# Patient Record
Sex: Male | Born: 1940 | Race: White | Hispanic: No | Marital: Single | Smoking: Former smoker
Health system: Southern US, Community
[De-identification: ages and names within clinical notes are randomized; demographics above are authoritative.]

## PROBLEM LIST (undated history)

## (undated) DIAGNOSIS — E119 Type 2 diabetes mellitus without complications: Secondary | ICD-10-CM

## (undated) DIAGNOSIS — I10 Essential (primary) hypertension: Secondary | ICD-10-CM

## (undated) DIAGNOSIS — I4891 Unspecified atrial fibrillation: Secondary | ICD-10-CM

## (undated) DIAGNOSIS — I509 Heart failure, unspecified: Secondary | ICD-10-CM

## (undated) DIAGNOSIS — I2510 Atherosclerotic heart disease of native coronary artery without angina pectoris: Secondary | ICD-10-CM

## (undated) HISTORY — PX: CARDIAC SURGERY: SHX584

## (undated) HISTORY — PX: CORONARY ARTERY BYPASS GRAFT: SHX141

---

## 2006-09-12 ENCOUNTER — Inpatient Hospital Stay: Payer: Self-pay | Admitting: Internal Medicine

## 2006-09-21 ENCOUNTER — Emergency Department: Payer: Self-pay | Admitting: Internal Medicine

## 2010-12-21 ENCOUNTER — Ambulatory Visit: Payer: Self-pay | Admitting: Family Medicine

## 2017-06-28 ENCOUNTER — Other Ambulatory Visit: Payer: Self-pay | Admitting: Family Medicine

## 2017-06-28 ENCOUNTER — Ambulatory Visit
Admission: RE | Admit: 2017-06-28 | Discharge: 2017-06-28 | Disposition: A | Discharge status: Home / Self Care | Payer: Medicare HMO | Source: Ambulatory Visit | Attending: Family Medicine | Admitting: Family Medicine

## 2017-06-28 DIAGNOSIS — R0602 Shortness of breath: Secondary | ICD-10-CM | POA: Insufficient documentation

## 2017-06-28 DIAGNOSIS — J948 Other specified pleural conditions: Secondary | ICD-10-CM | POA: Insufficient documentation

## 2017-10-05 ENCOUNTER — Inpatient Hospital Stay
Admit: 2017-10-05 | Discharge: 2017-10-05 | Disposition: A | Discharge status: Home / Self Care | Payer: Medicare HMO | Attending: Internal Medicine | Admitting: Internal Medicine

## 2017-10-05 ENCOUNTER — Other Ambulatory Visit: Payer: Self-pay

## 2017-10-05 ENCOUNTER — Encounter: Payer: Self-pay | Admitting: Emergency Medicine

## 2017-10-05 ENCOUNTER — Emergency Department: Payer: Medicare HMO

## 2017-10-05 ENCOUNTER — Inpatient Hospital Stay
Admission: EM | Admit: 2017-10-05 | Discharge: 2017-10-09 | DRG: 291 | Disposition: A | Discharge status: Home Health | Payer: Medicare HMO | Attending: Internal Medicine | Admitting: Internal Medicine

## 2017-10-05 DIAGNOSIS — I509 Heart failure, unspecified: Secondary | ICD-10-CM

## 2017-10-05 DIAGNOSIS — Z951 Presence of aortocoronary bypass graft: Secondary | ICD-10-CM

## 2017-10-05 DIAGNOSIS — I501 Left ventricular failure: Secondary | ICD-10-CM | POA: Diagnosis present

## 2017-10-05 DIAGNOSIS — Z7984 Long term (current) use of oral hypoglycemic drugs: Secondary | ICD-10-CM

## 2017-10-05 DIAGNOSIS — I2510 Atherosclerotic heart disease of native coronary artery without angina pectoris: Secondary | ICD-10-CM | POA: Diagnosis present

## 2017-10-05 DIAGNOSIS — Z7982 Long term (current) use of aspirin: Secondary | ICD-10-CM | POA: Diagnosis not present

## 2017-10-05 DIAGNOSIS — R749 Abnormal serum enzyme level, unspecified: Secondary | ICD-10-CM | POA: Diagnosis present

## 2017-10-05 DIAGNOSIS — E119 Type 2 diabetes mellitus without complications: Secondary | ICD-10-CM | POA: Diagnosis present

## 2017-10-05 DIAGNOSIS — Z8249 Family history of ischemic heart disease and other diseases of the circulatory system: Secondary | ICD-10-CM

## 2017-10-05 DIAGNOSIS — Z6836 Body mass index (BMI) 36.0-36.9, adult: Secondary | ICD-10-CM

## 2017-10-05 DIAGNOSIS — Z880 Allergy status to penicillin: Secondary | ICD-10-CM | POA: Diagnosis not present

## 2017-10-05 DIAGNOSIS — I248 Other forms of acute ischemic heart disease: Secondary | ICD-10-CM | POA: Diagnosis present

## 2017-10-05 DIAGNOSIS — J9601 Acute respiratory failure with hypoxia: Secondary | ICD-10-CM | POA: Diagnosis present

## 2017-10-05 DIAGNOSIS — Z9981 Dependence on supplemental oxygen: Secondary | ICD-10-CM

## 2017-10-05 DIAGNOSIS — Z79899 Other long term (current) drug therapy: Secondary | ICD-10-CM | POA: Diagnosis not present

## 2017-10-05 DIAGNOSIS — Z87891 Personal history of nicotine dependence: Secondary | ICD-10-CM | POA: Diagnosis not present

## 2017-10-05 DIAGNOSIS — I482 Chronic atrial fibrillation: Secondary | ICD-10-CM | POA: Diagnosis present

## 2017-10-05 DIAGNOSIS — Z7901 Long term (current) use of anticoagulants: Secondary | ICD-10-CM | POA: Diagnosis not present

## 2017-10-05 DIAGNOSIS — I110 Hypertensive heart disease with heart failure: Secondary | ICD-10-CM | POA: Diagnosis not present

## 2017-10-05 HISTORY — DX: Heart failure, unspecified: I50.9

## 2017-10-05 HISTORY — DX: Type 2 diabetes mellitus without complications: E11.9

## 2017-10-05 HISTORY — DX: Atherosclerotic heart disease of native coronary artery without angina pectoris: I25.10

## 2017-10-05 HISTORY — DX: Essential (primary) hypertension: I10

## 2017-10-05 HISTORY — DX: Unspecified atrial fibrillation: I48.91

## 2017-10-05 LAB — BASIC METABOLIC PANEL
ANION GAP: 8 (ref 5–15)
BUN: 22 mg/dL — ABNORMAL HIGH (ref 6–20)
CHLORIDE: 98 mmol/L — AB (ref 101–111)
CO2: 31 mmol/L (ref 22–32)
Calcium: 8.6 mg/dL — ABNORMAL LOW (ref 8.9–10.3)
Creatinine, Ser: 1.28 mg/dL — ABNORMAL HIGH (ref 0.61–1.24)
GFR calc Af Amer: >60 mL/min (ref >60)
GFR calc non Af Amer: 53 mL/min — ABNORMAL LOW (ref >60)
GLUCOSE: 217 mg/dL — AB (ref 65–99)
POTASSIUM: 4.2 mmol/L (ref 3.5–5.1)
Sodium: 137 mmol/L (ref 135–145)

## 2017-10-05 LAB — CBC WITH DIFFERENTIAL/PLATELET
BASOS ABS: 0.1 10*3/uL (ref 0–0.1)
Basophils Relative: 1 %
Eosinophils Absolute: 0.1 10*3/uL (ref 0–0.7)
Eosinophils Relative: 1 %
HEMATOCRIT: 38.2 % — AB (ref 40.0–52.0)
Hemoglobin: 12.1 g/dL — ABNORMAL LOW (ref 13.0–18.0)
LYMPHS PCT: 12 %
Lymphs Abs: 0.7 10*3/uL — ABNORMAL LOW (ref 1.0–3.6)
MCH: 27.5 pg (ref 26.0–34.0)
MCHC: 31.7 g/dL — ABNORMAL LOW (ref 32.0–36.0)
MCV: 86.8 fL (ref 80.0–100.0)
MONO ABS: 0.5 10*3/uL (ref 0.2–1.0)
Monocytes Relative: 8 %
NEUTROS ABS: 4.7 10*3/uL (ref 1.4–6.5)
Neutrophils Relative %: 78 %
Platelets: 198 10*3/uL (ref 150–440)
RBC: 4.4 MIL/uL (ref 4.40–5.90)
RDW: 16.8 % — AB (ref 11.5–14.5)
WBC: 6 10*3/uL (ref 3.8–10.6)

## 2017-10-05 LAB — HEMOGLOBIN A1C
HEMOGLOBIN A1C: 6.8 % — AB (ref 4.8–5.6)
Mean Plasma Glucose: 148.46 mg/dL

## 2017-10-05 LAB — TROPONIN I
TROPONIN I: 0.31 ng/mL — AB (ref <0.03)
Troponin I: 0.32 ng/mL (ref <0.03)
Troponin I: 0.32 ng/mL (ref <0.03)

## 2017-10-05 LAB — HEPARIN LEVEL (UNFRACTIONATED): HEPARIN UNFRACTIONATED: 1.75 [IU]/mL — AB (ref 0.30–0.70)

## 2017-10-05 LAB — GLUCOSE, CAPILLARY
GLUCOSE-CAPILLARY: 184 mg/dL — AB (ref 65–99)
GLUCOSE-CAPILLARY: 168 mg/dL — AB (ref 65–99)

## 2017-10-05 LAB — PROTIME-INR
INR: 1.21
PROTHROMBIN TIME: 15.2 s (ref 11.4–15.2)

## 2017-10-05 LAB — BRAIN NATRIURETIC PEPTIDE: B Natriuretic Peptide: 620 pg/mL — ABNORMAL HIGH (ref 0.0–100.0)

## 2017-10-05 LAB — APTT: APTT: 38 s — AB (ref 24–36)

## 2017-10-05 LAB — MAGNESIUM: Magnesium: 2.1 mg/dL (ref 1.7–2.4)

## 2017-10-05 MED ORDER — PRAVASTATIN SODIUM 40 MG PO TABS
40.0000 mg | ORAL_TABLET | Freq: Every day | ORAL | Status: DC
  Administered 2017-10-05 – 2017-10-09 (×5): 40 mg via ORAL
  Filled 2017-10-05 (×5): qty 1

## 2017-10-05 MED ORDER — ONDANSETRON HCL 4 MG/2ML IJ SOLN: 4.0000 mg | Freq: Four times a day (QID) | INTRAMUSCULAR | Status: DC | PRN

## 2017-10-05 MED ORDER — ASPIRIN EC 81 MG PO TBEC
81.0000 mg | DELAYED_RELEASE_TABLET | Freq: Every day | ORAL | Status: DC
  Administered 2017-10-05 – 2017-10-09 (×5): 81 mg via ORAL
  Filled 2017-10-05 (×5): qty 1

## 2017-10-05 MED ORDER — BISACODYL 5 MG PO TBEC: 5.0000 mg | DELAYED_RELEASE_TABLET | Freq: Every day | ORAL | Status: DC | PRN

## 2017-10-05 MED ORDER — HYDRALAZINE HCL 20 MG/ML IJ SOLN: 10.0000 mg | Freq: Four times a day (QID) | INTRAMUSCULAR | Status: DC | PRN

## 2017-10-05 MED ORDER — SODIUM CHLORIDE 0.9% FLUSH
3.0000 mL | Freq: Two times a day (BID) | INTRAVENOUS | Status: DC
  Administered 2017-10-05 – 2017-10-08 (×8): 3 mL via INTRAVENOUS

## 2017-10-05 MED ORDER — HEPARIN BOLUS VIA INFUSION
4000.0000 [IU] | Freq: Once | INTRAVENOUS | Status: AC
  Administered 2017-10-05: 4000 [IU] via INTRAVENOUS
  Filled 2017-10-05: qty 4000

## 2017-10-05 MED ORDER — LOSARTAN POTASSIUM 50 MG PO TABS
100.0000 mg | ORAL_TABLET | Freq: Every day | ORAL | Status: DC
  Administered 2017-10-05 – 2017-10-09 (×5): 100 mg via ORAL
  Filled 2017-10-05 (×5): qty 2

## 2017-10-05 MED ORDER — HYDROCODONE-ACETAMINOPHEN 5-325 MG PO TABS
1.0000 | ORAL_TABLET | ORAL | Status: DC | PRN
  Administered 2017-10-09 (×2): 1 via ORAL
  Filled 2017-10-05: qty 2
  Filled 2017-10-05 (×2): qty 1

## 2017-10-05 MED ORDER — SENNOSIDES-DOCUSATE SODIUM 8.6-50 MG PO TABS: 1.0000 | ORAL_TABLET | Freq: Every evening | ORAL | Status: DC | PRN

## 2017-10-05 MED ORDER — ONDANSETRON HCL 4 MG PO TABS: 4.0000 mg | ORAL_TABLET | Freq: Four times a day (QID) | ORAL | Status: DC | PRN

## 2017-10-05 MED ORDER — ALBUTEROL SULFATE (2.5 MG/3ML) 0.083% IN NEBU: 2.5000 mg | INHALATION_SOLUTION | RESPIRATORY_TRACT | Status: DC | PRN

## 2017-10-05 MED ORDER — ACETAMINOPHEN 325 MG PO TABS: 650.0000 mg | ORAL_TABLET | Freq: Four times a day (QID) | ORAL | Status: DC | PRN

## 2017-10-05 MED ORDER — INSULIN ASPART 100 UNIT/ML ~~LOC~~ SOLN
0.0000 [IU] | Freq: Every day | SUBCUTANEOUS | Status: DC
  Administered 2017-10-05: 0 [IU] via SUBCUTANEOUS
  Administered 2017-10-06 – 2017-10-08 (×3): 2 [IU] via SUBCUTANEOUS
  Filled 2017-10-05 (×3): qty 1

## 2017-10-05 MED ORDER — FUROSEMIDE 10 MG/ML IJ SOLN
40.0000 mg | Freq: Once | INTRAMUSCULAR | Status: AC
  Administered 2017-10-05: 40 mg via INTRAVENOUS
  Filled 2017-10-05: qty 4

## 2017-10-05 MED ORDER — SODIUM CHLORIDE 0.9% FLUSH: 3.0000 mL | INTRAVENOUS | Status: DC | PRN

## 2017-10-05 MED ORDER — INSULIN ASPART 100 UNIT/ML ~~LOC~~ SOLN
0.0000 [IU] | Freq: Three times a day (TID) | SUBCUTANEOUS | Status: DC
  Administered 2017-10-05: 2 [IU] via SUBCUTANEOUS
  Administered 2017-10-06 – 2017-10-08 (×7): 1 [IU] via SUBCUTANEOUS
  Administered 2017-10-08 – 2017-10-09 (×3): 2 [IU] via SUBCUTANEOUS
  Administered 2017-10-09: 1 [IU] via SUBCUTANEOUS
  Filled 2017-10-05 (×13): qty 1

## 2017-10-05 MED ORDER — METOPROLOL SUCCINATE ER 50 MG PO TB24
50.0000 mg | ORAL_TABLET | Freq: Every day | ORAL | Status: DC
  Administered 2017-10-05 – 2017-10-09 (×5): 50 mg via ORAL
  Filled 2017-10-05 (×5): qty 1

## 2017-10-05 MED ORDER — PERFLUTREN LIPID MICROSPHERE
1.0000 mL | INTRAVENOUS | Status: AC | PRN
  Administered 2017-10-05: 2 mL via INTRAVENOUS
  Filled 2017-10-05: qty 10

## 2017-10-05 MED ORDER — APIXABAN 5 MG PO TABS
5.0000 mg | ORAL_TABLET | Freq: Two times a day (BID) | ORAL | Status: DC
  Administered 2017-10-05 – 2017-10-09 (×9): 5 mg via ORAL
  Filled 2017-10-05 (×9): qty 1

## 2017-10-05 MED ORDER — ACETAMINOPHEN 650 MG RE SUPP: 650.0000 mg | Freq: Four times a day (QID) | RECTAL | Status: DC | PRN

## 2017-10-05 MED ORDER — FUROSEMIDE 10 MG/ML IJ SOLN
40.0000 mg | Freq: Two times a day (BID) | INTRAMUSCULAR | Status: DC
  Administered 2017-10-05 – 2017-10-07 (×4): 40 mg via INTRAVENOUS
  Filled 2017-10-05 (×4): qty 4

## 2017-10-05 MED ORDER — HEPARIN (PORCINE) IN NACL 100-0.45 UNIT/ML-% IJ SOLN
1200.0000 [IU]/h | INTRAMUSCULAR | Status: DC
  Administered 2017-10-05: 1200 [IU]/h via INTRAVENOUS
  Filled 2017-10-05: qty 250

## 2017-10-05 MED ORDER — SODIUM CHLORIDE 0.9 % IV SOLN: 250.0000 mL | INTRAVENOUS | Status: DC | PRN

## 2017-10-05 NOTE — ED Notes (Signed)
Date and time results received: 10/05/17 1151  Test: Troponin Critical Value: 0.32  Name of Provider Notified: Schaevitz

## 2017-10-05 NOTE — Progress Notes (Signed)
ANTICOAGULATION CONSULT NOTE - Initial Consult  Pharmacy Consult for Heparin Drip  Indication: chest pain/ACS  Allergies  Allergen Reactions  . Penicillins Rash    Patient Measurements: Height: 5\' 11"  (180.3 cm) Weight: 260 lb (117.9 kg) IBW/kg (Calculated) : 75.3 Heparin Dosing Weight: 97 kg  Vital Signs: Temp: 98 F (36.7 C) (12/12 1057) Temp Source: Oral (12/12 1057) BP: 171/87 (12/12 1057) Pulse Rate: 98 (12/12 1057)  Labs: Recent Labs    10/05/17 1059  HGB 12.1*  HCT 38.2*  PLT 198  CREATININE 1.28*  TROPONINI 0.32*    Estimated Creatinine Clearance: 64.1 mL/min (A) (by C-G formula based on SCr of 1.28 mg/dL (H)).   Medical History: Past Medical History:  Diagnosis Date  . CHF (congestive heart failure) (HCC)   . Diabetes mellitus without complication (HCC)   . Hypertension     Assessment: Pharmacy consulted for heparin dosing and monitoring in 76 yo male with SOB and Troponin I of 0.32. Patient does take Eliquis (Apixaban) at home. Last reported dose was 12/11.   Goal of Therapy:  Heparin level 0.3-0.7 units/ml aPTT 66-102 seconds Monitor platelets by anticoagulation protocol: Yes   Plan:  Patient's dosing weight is 97kg. Baseline labs ordered  Give 4000 units bolus x 1 Start heparin infusion at 1200 units/hr Check anti-Xa level and aPTT in 8 hours and daily while on heparin.  Will need to adjust heparin infusion based on aPTT levels until aPTT and HL correlate, if initial HL is elevated.  Continue to monitor H&H and platelets  Gardner Candle, PharmD, BCPS Clinical Pharmacist 10/05/2017 12:15 PM

## 2017-10-05 NOTE — Plan of Care (Signed)
  Clinical Measurements: Ability to maintain clinical measurements within normal limits will improve 10/05/2017 1645 - Not Progressing by Raynald Blend, RN Note Troponin x 2 = .32. Per physician = demand ischemia. Will continue to monitor labs. Jari Favre [facility]

## 2017-10-05 NOTE — Consult Note (Signed)
[facility]  CARDIOLOGY CONSULT NOTE  Patient ID: Gilbert Patrick MRN: [id number] DOB/AGE: 76/06/1941 76 y.o.  Admit date: 10/05/2017 Referring Physician Dr. Imogene Burnhen Primary Physician Dr. Quillian QuinceBliss  Primary Cardiologist none Reason for Consultation chf  HPI: Pt is a 76 yo male with apparent history of afib, cad, chf and dm with no recent cardiology follow up. He presented to the er with several weeks in increasing sob. He had been off of all of his meds for several days and has been eating a very high sodium diet. His son states he has had peripheral edema for years. He is on oxygen at home and is unaware of possible sleep apnea. He has a mild troponin elevation to 0.32 which has been unchanged. His BNP is 620. He has been on eliquis per his outpatient fp physician. CXR shows pulmonary edema. He complains of weakness and sob but no chest pain.   Review of Systems  HENT: Negative.   Eyes: Negative.   Respiratory: Positive for cough and shortness of breath.   Cardiovascular: Positive for leg swelling.  Gastrointestinal: Negative.   Genitourinary: Negative.   Musculoskeletal: Negative.   Skin: Negative.   Neurological: Positive for weakness.  Endo/Heme/Allergies: Negative.   Psychiatric/Behavioral: Negative.     Past Medical History:  Diagnosis Date  . A-fib (HCC)   . CAD (coronary artery disease)   . CHF (congestive heart failure) (HCC)   . Diabetes mellitus without complication (HCC)   . Hypertension     Family History  Problem Relation Age of Onset  . Cancer Mother   . Heart attack Father     Social History   Socioeconomic History  . Marital status: Single    Spouse name: Not on file  . Number of children: Not on file  . Years of education: Not on file  . Highest education level: Not on file  Social Needs  . Financial resource strain: Not on file  . Food insecurity - worry: Not on file  . Food insecurity - inability: Not on  file  . Transportation needs - medical: Not on file  . Transportation needs - non-medical: Not on file  Occupational History  . Not on file  Tobacco Use  . Smoking status: Former Smoker  Substance and Sexual Activity  . Alcohol use: No    Frequency: Never  . Drug use: No  . Sexual activity: Not on file  Other Topics Concern  . Not on file  Social History Narrative  . Not on file    Past Surgical History:  Procedure Laterality Date  . CARDIAC SURGERY    . CORONARY ARTERY BYPASS GRAFT       Medications Prior to Admission  Medication Sig Dispense Refill Last Dose  . apixaban (ELIQUIS) 5 MG TABS tablet Take 5 mg by mouth 2 (two) times daily.   Past Week at 0800  . aspirin EC 81 MG tablet Take 81 mg by mouth daily.   10/04/2017 at 0800  . furosemide (LASIX) 20 MG tablet Take 40-60 mg by mouth daily.   10/04/2017 at 0800  . glipiZIDE (GLUCOTROL) 10 MG tablet Take 10 mg by mouth 2 (two) times daily before a meal.   Past Week at 1800  . metoprolol succinate (TOPROL-XL) 50 MG 24 hr tablet Take 50 mg by mouth daily. Take with or immediately following a meal.    10/04/2017 at 0800  . pravastatin (PRAVACHOL) 40 MG tablet Take  40 mg by mouth daily.   10/04/2017 at 0800  . sitaGLIPtin (JANUVIA) 100 MG tablet Take 100 mg by mouth daily.   10/04/2017 at 0800  . losartan (COZAAR) 100 MG tablet Take 100 mg by mouth daily.   Not Taking at Unknown time    Physical Exam: Blood pressure (!) 182/80, pulse 99, temperature 98.2 F (36.8 C), temperature source Oral, resp. rate 18, height 5\' 11"  (1.803 m), weight 119.7 kg (263 lb 14.4 oz), SpO2 100 %.   Wt Readings from Last 1 Encounters:  10/05/17 119.7 kg (263 lb 14.4 oz)     General appearance: alert and cooperative Resp: rhonchi bibasilar Chest wall: no tenderness Cardio: irregularly irregular rhythm GI: morbid obesity with abdominal distention Extremities: edema 4+ edema in lower extremities.  Neurologic: Grossly normal  Labs:   Lab  Results  Component Value Date   WBC 6.0 10/05/2017   HGB 12.1 (L) 10/05/2017   HCT 38.2 (L) 10/05/2017   MCV 86.8 10/05/2017   PLT 198 10/05/2017    Recent Labs  Lab 10/05/17 1059  NA 137  K 4.2  CL 98*  CO2 31  BUN 22*  CREATININE 1.28*  CALCIUM 8.6*  GLUCOSE 217*   Lab Results  Component Value Date   TROPONINI 0.32 (HH) 10/05/2017      Radiology: Bibasilar haziness left greater than right c/w pulmonary edema. EKG: afib with controlled vr  ASSESSMENT AND PLAN:  Pt with history of dm, afib treated with rate control and anticoagulation with eliquis, history of htn and apparent cad who was admitted with chornic lower extremety edema and incrassing sob with possible pumonary edema. Etiology ower extremety edema is unclear. DVT possible but less likely assuming ocmpliance with his eliquis. Left heart failure may also be present. Will evaluate lv and left sided valves with echo. Right sided failure may be secondary to sleep apena. Pt is on oxygen at night but body habitus suggests sleep apnea. Will need evaluate with sleep study and will assess rv pressures on echo. Will carefully diurese and follow. Consideration for evaluation for lymphedema as well. Signed: Dalia Heading MD, [facility] 10/05/2017, 4:56 PM

## 2017-10-05 NOTE — H&P (Addendum)
[facility] [place] [facility]   PATIENT NAME: Gilbert Patrick    MR#:  [id number]  DATE OF BIRTH:  09-Jun-1941  DATE OF ADMISSION:  10/05/2017  PRIMARY CARE PHYSICIAN: Dortha Kern, MD   REQUESTING/REFERRING PHYSICIAN: Myrna Blazer, MD  CHIEF COMPLAINT:   Chief Complaint  Patient presents with  . Shortness of Breath   Shortness of breath and worsening leg swelling for 1 week. HISTORY OF PRESENT ILLNESS:  Gilbert Patrick  is a 76 y.o. male with a known history of CHF, CAD, A. fib on Eliquis, diabetes and hypertension.  The patient presented the ED with above chief complaints.  He has had a worsening cough, shortness breath and leg swelling for the past several days.  He was found hypoxia with O2 saturation 80s on oxygen by nasal cannula.  He was found to have elevated BNP and troponin.  Chest x-ray show pulmonary edema.  PAST MEDICAL HISTORY:   Past Medical History:  Diagnosis Date  . A-fib (HCC)   . CAD (coronary artery disease)   . CHF (congestive heart failure) (HCC)   . Diabetes mellitus without complication (HCC)   . Hypertension     PAST SURGICAL HISTORY:  CABG.  SOCIAL HISTORY:   Social History   Tobacco Use  . Smoking status: Former Smoker  Substance Use Topics  . Alcohol use: No    Frequency: Never    FAMILY HISTORY:   Family History  Problem Relation Age of Onset  . Cancer Mother   . Heart attack Father     DRUG ALLERGIES:   Allergies  Allergen Reactions  . Penicillins Rash    REVIEW OF SYSTEMS:   Review of Systems  Constitutional: Positive for malaise/fatigue. Negative for chills and fever.  HENT: Negative for sore throat.   Eyes: Negative for blurred vision and double vision.  Respiratory: Positive for cough and shortness of breath. Negative for hemoptysis, wheezing and stridor.   Cardiovascular: Positive for leg swelling. Negative for chest pain, palpitations and orthopnea.  Gastrointestinal: Negative  for abdominal pain, blood in stool, diarrhea, melena, nausea and vomiting.  Genitourinary: Negative for dysuria, flank pain and hematuria.  Musculoskeletal: Negative for back pain and joint pain.  Neurological: Negative for dizziness, sensory change, focal weakness, seizures, loss of consciousness, weakness and headaches.  Endo/Heme/Allergies: Negative for polydipsia.  Psychiatric/Behavioral: Negative for depression. The patient is not nervous/anxious.     MEDICATIONS AT HOME:   Prior to Admission medications   Medication Sig Start Date End Date Taking? Authorizing Provider  apixaban (ELIQUIS) 5 MG TABS tablet Take 5 mg by mouth 2 (two) times daily.   Yes [provider]  aspirin EC 81 MG tablet Take 81 mg by mouth daily.   Yes [provider]  furosemide (LASIX) 20 MG tablet Take 40-60 mg by mouth daily.   Yes [provider]  glipiZIDE (GLUCOTROL) 10 MG tablet Take 10 mg by mouth 2 (two) times daily before a meal.   Yes [provider]  metoprolol succinate (TOPROL-XL) 50 MG 24 hr tablet Take 50 mg by mouth daily. Take with or immediately following a meal.    Yes [provider]  pravastatin (PRAVACHOL) 40 MG tablet Take 40 mg by mouth daily.   Yes [provider]  sitaGLIPtin (JANUVIA) 100 MG tablet Take 100 mg by mouth daily.   Yes [provider]  losartan (COZAAR) 100 MG tablet Take 100 mg by mouth daily.  [provider]      VITAL SIGNS:  Blood pressure (!) 171/87, pulse 98, temperature 98 F (36.7 C), temperature source Oral, resp. rate (!) 28, height 5\' 11"  (1.803 m), weight 260 lb (117.9 kg), SpO2 99 %.  PHYSICAL EXAMINATION:  Physical Exam  GENERAL:  76 y.o.-year-old patient lying in the bed with no acute distress.  EYES: Pupils equal, round, reactive to light and accommodation. No scleral icterus. Extraocular muscles intact.  HEENT: Head atraumatic, normocephalic. Oropharynx and nasopharynx clear.    NECK:  Supple, no jugular venous distention. No thyroid enlargement, no tenderness.  LUNGS: Normal breath sounds bilaterally, no wheezing, but has bilateral basilar rales, no rhonchi or crepitation. No use of accessory muscles of respiration.  CARDIOVASCULAR: S1, S2 normal. No murmurs, rubs, or gallops.  ABDOMEN: Soft, nontender, nondistended. Bowel sounds present. No organomegaly or mass.  EXTREMITIES: No cyanosis, or clubbing.  Bilateral leg and foot swelling 2+. NEUROLOGIC: Cranial nerves II through XII are intact. Muscle strength 5/5 in all extremities. Sensation intact. Gait not checked.  PSYCHIATRIC: The patient is alert and oriented x 3.  SKIN: No obvious rash, lesion, or ulcer.   LABORATORY PANEL:   CBC Recent Labs  Lab 10/05/17 1059  WBC 6.0  HGB 12.1*  HCT 38.2*  PLT 198   ------------------------------------------------------------------------------------------------------------------  Chemistries  Recent Labs  Lab 10/05/17 1059  NA 137  K 4.2  CL 98*  CO2 31  GLUCOSE 217*  BUN 22*  CREATININE 1.28*  CALCIUM 8.6*   ------------------------------------------------------------------------------------------------------------------  Cardiac Enzymes Recent Labs  Lab 10/05/17 1059  TROPONINI 0.32*   ------------------------------------------------------------------------------------------------------------------  RADIOLOGY:  Dg Chest 1 View  Result Date: 10/05/2017 CLINICAL DATA:  Increasing shortness of breath. Nonproductive cough. Wheezing. EXAM: CHEST 1 VIEW COMPARISON:  06/28/2017 FINDINGS: There is slight haziness at the lung bases which I suspect represents mild pulmonary edema. Overall heart size and pulmonary vascularity are normal. 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  Aortic atherosclerosis. No acute bone abnormality. IMPRESSION: Slight bibasilar haziness, left greater than right which I suspect represents mild pulmonary edema.  Electronically Signed   By: Francene BoyersJames  Maxwell M.D.   On: 10/05/2017 11:46      IMPRESSION AND PLAN:   Acute respiratory failure with hypoxia due to CHF. The patient will be admitted to telemetry floor. Continue oxygen by nasal cannula, NEB as needed.  Acute on chronic CHF, unknown type. Started Lasix 40 mg IV twice daily, follow CHF protocol, echocardiogram and a cardiology consult.  Elevated troponin due to demanding ischemia. Continue Eliquis and follow-up Dr. Gwen PoundsKowalski.  History of chronic A. Fib with RVR Continue Eliquis and Toprol.  Hypertension, accelerated. Continue Lasix, Toprol and losartan, IV hydralazine as needed.  CAD, continue aspirin and Pravachol. Diabetes.  Start sliding scale.  All the records are reviewed and case discussed with ED provider. Management plans discussed with the patient, family and they are in agreement.  CODE STATUS: Full code  TOTAL TIME TAKING CARE OF THIS PATIENT: 58 minutes.    Shaune PollackQing Rilan Eiland M.D on 10/05/2017 at 1:15 PM  Between 7am to 6pm - Pager - [phone]  After 6pm go to [email or web address] - Social research officer, governmentpassword [email or web address]  [facility] [place] [facility]  Office  [phone]  CC: Primary care physician; Dortha KernBliss, Laura K, MD   Note: This dictation was prepared with Dragon dictation along with smaller phrase technology. Any transcriptional errors that result from this process are unin

## 2017-10-05 NOTE — ED Triage Notes (Signed)
Patient sent from PCP via [facility] for SOB. Reports increasing difficulty breathing over the last few weeks. Denies fever. Reports non-productive cough. Patient oxygen in the low 80's on room air. Patient unable placed on simple face mask with oxygenat 98%. Patient tachypneic with audible wheezing. Alert and oriented x4.

## 2017-10-05 NOTE — ED Provider Notes (Addendum)
[facility] Emergency Department Provider Note  ____________________________________________   First MD Initiated Contact with Patient 10/05/17 1051     (approximate)  I have reviewed the triage vital signs and the nursing notes.   HISTORY  Chief Complaint Shortness of Breath   HPI Gilbert Patrick is a 76 y.o. male with a history of CHF and atrial fibrillation on Eliquis as well as supplemental home oxygen who breath.  He reports that he has been having worsening shortness of breath over the past several days and went to an appointment at his primary care doctor's office this morning was found to have an oxygen saturation in the low 80s on his nasal cannula oxygen.  He was subsequently sent to the emergency department for further evaluation.  He is denying any pain.  Denies any worsening in the swelling to his bilateral lower extremities.  He says that he is trying to reconcile his medications with his primary care doctor as he says he is supposed to be on two cardiac medications but is only been taking 1.  Patient reports a dry cough with "chest congestion."   Past Medical History:  Diagnosis Date  . CHF (congestive heart failure) (HCC)   . Diabetes mellitus without complication (HCC)   . Hypertension     There are no active problems to display for this patient.     Prior to Admission medications   Medication Sig Start Date End Date Taking? Authorizing Provider  apixaban (ELIQUIS) 5 MG TABS tablet Take 5 mg by mouth 2 (two) times daily.   Yes [provider]  aspirin EC 81 MG tablet Take 81 mg by mouth daily.   Yes [provider]  furosemide (LASIX) 20 MG tablet Take 40-60 mg by mouth daily.   Yes [provider]  glipiZIDE (GLUCOTROL) 10 MG tablet Take 10 mg by mouth 2 (two) times daily before a meal.   Yes [provider]  metoprolol succinate (TOPROL-XL) 50 MG 24 hr tablet Take 50 mg by mouth daily. Take with or  immediately following a meal.    Yes [provider]  pravastatin (PRAVACHOL) 40 MG tablet Take 40 mg by mouth daily.   Yes [provider]  sitaGLIPtin (JANUVIA) 100 MG tablet Take 100 mg by mouth daily.   Yes [provider]  losartan (COZAAR) 100 MG tablet Take 100 mg by mouth daily.    [provider]    Allergies Penicillins  No family history on file.  Social History Social History   Tobacco Use  . Smoking status: Former Smoker  Substance Use Topics  . Alcohol use: No    Frequency: Never  . Drug use: No    Review of Systems  Constitutional: No fever/chills Eyes: No visual changes. ENT: No sore throat. Cardiovascular: Denies chest pain. Respiratory: As above  gastrointestinal: No abdominal pain.  No nausea, no vomiting.  No diarrhea.  No constipation. Genitourinary: Negative for dysuria. Musculoskeletal: Negative for back pain. Skin: Negative for rash. Neurological: Negative for headaches, focal weakness or numbness.   ____________________________________________   PHYSICAL EXAM:  VITAL SIGNS: ED Triage Vitals  Enc Vitals Group     BP      Pulse      Resp      Temp      Temp src      SpO2      Weight      Height      Head Circumference  Peak Flow      Pain Score      Pain Loc      Pain Edu?      Excl. in GC?     Constitutional: Alert and oriented. Well appearing and in no acute distress. Eyes: Conjunctivae are normal.  Head: Atraumatic. Nose: No congestion/rhinnorhea.  Wearing nasal cannula oxygen. Mouth/Throat: Mucous membranes are moist.  Neck: No stridor.   Cardiovascular: Mildly tachycardic with an irregularly irregular rhythm.  Grossly normal heart sounds.  Good peripheral circulation. Respiratory: Tachypneic with rales to the bilateral lower fields. Gastrointestinal: Soft and nontender. No distention. No CVA tenderness. Musculoskeletal: Moderate to severe bilateral lower extremity edema without  induration.  No warmth to the bilateral lower extremities.   Neurologic:  Normal speech and language. No gross focal neurologic deficits are appreciated. Skin:  Skin is warm, dry and intact. No rash noted. Psychiatric: Mood and affect are normal. Speech and behavior are normal.  ____________________________________________   LABS (all labs ordered are listed, but only abnormal results are displayed)  Labs Reviewed  CBC WITH DIFFERENTIAL/PLATELET - Abnormal; Notable for the following components:      Result Value   Hemoglobin 12.1 (*)    HCT 38.2 (*)    MCHC 31.7 (*)    RDW 16.8 (*)    Lymphs Abs 0.7 (*)    All other components within normal limits  BASIC METABOLIC PANEL - Abnormal; Notable for the following components:   Chloride 98 (*)    Glucose, Bld 217 (*)    BUN 22 (*)    Creatinine, Ser 1.28 (*)    Calcium 8.6 (*)    GFR calc non Af Amer 53 (*)    All other components within normal limits  BRAIN NATRIURETIC PEPTIDE - Abnormal; Notable for the following components:   B Natriuretic Peptide 620.0 (*)    All other components within normal limits  TROPONIN I - Abnormal; Notable for the following components:   Troponin I 0.32 (*)    All other components within normal limits  APTT - Abnormal; Notable for the following components:   aPTT 38 (*)    All other components within normal limits  PROTIME-INR  HEPARIN LEVEL (UNFRACTIONATED)   ____________________________________________  EKG  ED ECG REPORT I, Arelia Longest, the attending physician, personally viewed and interpreted this ECG.   Date: 10/05/2017  EKG Time: 1103  Rate: 95  Rhythm: atrial fibrillation, rate 95  Axis: Right axis  Intervals:none  ST&T Change: T wave inversions in lead II, 3 with biphasic T wave in aVF.  Biphasic T waves in V5 and V6. No previous for comparison ____________________________________________  RADIOLOGY  Mild  CHF ____________________________________________   PROCEDURES  Procedure(s) performed:   Procedures  Critical Care performed:   ____________________________________________   INITIAL IMPRESSION / ASSESSMENT AND PLAN / ED COURSE  Pertinent labs & imaging results that were available during my care of the patient were reviewed by me and considered in my medical decision making (see chart for details).  Differential includes, but is not limited to, viral syndrome, bronchitis including COPD exacerbation, pneumonia, reactive airway disease including asthma, CHF including exacerbation with or without pulmonary/interstitial edema, pneumothorax, ACS, thoracic trauma, and pulmonary embolism. As part of my medical decision making, I reviewed the following data within the electronic MEDICAL RECORD NUMBER Old chart reviewed.  However, there are no recent records and last primary care visits on record appear to be from 2010.   ----------------------------------------- 12:49 PM on  10/05/2017 -----------------------------------------  Patient with elevated troponin as well as BNP.  Likely decompensated heart failure.  Given Lasix IV.  Started with heparin.  Patient to be admitted to the hospital.  He is understanding of the diagnosis as well as the treatment plan willing to comply.  Signed out to Dr. Imogene Burnhen.     ____________________________________________   FINAL CLINICAL IMPRESSION(S) / ED DIAGNOSES  CHF.      NEW MEDICATIONS STARTED DURING THIS VISIT:  This SmartLink is deprecated. Use AVSMEDLIST instead to display the medication list for a patient.   Note:  This document was prepared using Dragon voice recognition software and may include unintentional dictation errors.     Myrna BlazerSchaevitz, David Matthew, MD 10/05/17 1249    Myrna BlazerSchaevitz, David Matthew, MD 10/05/17 1250

## 2017-10-06 LAB — BASIC METABOLIC PANEL
ANION GAP: 4 — AB (ref 5–15)
BUN: 25 mg/dL — ABNORMAL HIGH (ref 6–20)
CALCIUM: 8.5 mg/dL — AB (ref 8.9–10.3)
CO2: 33 mmol/L — AB (ref 22–32)
CREATININE: 1.34 mg/dL — AB (ref 0.61–1.24)
Chloride: 101 mmol/L (ref 101–111)
GFR, EST AFRICAN AMERICAN: 58 mL/min — AB (ref >60)
GFR, EST NON AFRICAN AMERICAN: 50 mL/min — AB (ref >60)
Glucose, Bld: 155 mg/dL — ABNORMAL HIGH (ref 65–99)
Potassium: 4.1 mmol/L (ref 3.5–5.1)
SODIUM: 138 mmol/L (ref 135–145)

## 2017-10-06 LAB — ECHOCARDIOGRAM COMPLETE
Height: 71 in
WEIGHTICAEL: 4222.4 [oz_av]

## 2017-10-06 LAB — GLUCOSE, CAPILLARY
GLUCOSE-CAPILLARY: 142 mg/dL — AB (ref 65–99)
GLUCOSE-CAPILLARY: 149 mg/dL — AB (ref 65–99)
Glucose-Capillary: 138 mg/dL — ABNORMAL HIGH (ref 65–99)
Glucose-Capillary: 215 mg/dL — ABNORMAL HIGH (ref 65–99)

## 2017-10-06 MED ORDER — ACETAMINOPHEN 500 MG PO TABS
1000.0000 mg | ORAL_TABLET | Freq: Four times a day (QID) | ORAL | Status: DC | PRN
  Administered 2017-10-06: 1000 mg via ORAL
  Filled 2017-10-06: qty 2

## 2017-10-06 NOTE — Progress Notes (Signed)
       [facility]  SUBJECTIVE: some better.    Vitals:   10/05/17 1936 10/06/17 0319 10/06/17 0809 10/06/17 1613  BP: 136/86 (!) 98/41 127/77 (!) 100/44  Pulse: 88 75 65 70  Resp: 18 18 18 18   Temp: 98.6 F (37 C) 98.1 F (36.7 C) 98 F (36.7 C) 97.8 F (36.6 C)  TempSrc: Oral Oral Oral Oral  SpO2: 96% 92% 99% 100%  Weight:  118.5 kg (261 lb 3.2 oz)    Height:        Intake/Output Summary (Last 24 hours) at 10/06/2017 1636 Last data filed at 10/06/2017 1448 Gross per 24 hour  Intake 240 ml  Output 850 ml  Net -610 ml    LABS: Basic Metabolic Panel: Recent Labs    10/05/17 1059 10/05/17 1425 10/06/17 0542  NA 137  --  138  K 4.2  --  4.1  CL 98*  --  101  CO2 31  --  33*  GLUCOSE 217*  --  155*  BUN 22*  --  25*  CREATININE 1.28*  --  1.34*  CALCIUM 8.6*  --  8.5*  MG  --  2.1  --    Liver Function Tests: No results for input(s): AST, ALT, ALKPHOS, BILITOT, PROT, ALBUMIN in the last 72 hours. No results for input(s): LIPASE, AMYLASE in the last 72 hours. CBC: Recent Labs    10/05/17 1059  WBC 6.0  NEUTROABS 4.7  HGB 12.1*  HCT 38.2*  MCV 86.8  PLT 198   Cardiac Enzymes: Recent Labs    10/05/17 1059 10/05/17 1425 10/05/17 1700  TROPONINI 0.32* 0.32* 0.31*   BNP: Invalid input(s): POCBNP D-Dimer: No results for input(s): DDIMER in the last 72 hours. Hemoglobin A1C: Recent Labs    10/05/17 1425  HGBA1C 6.8*   Fasting Lipid Panel: No results for input(s): CHOL, HDL, LDLCALC, TRIG, CHOLHDL, LDLDIRECT in the last 72 hours. Thyroid Function Tests: No results for input(s): TSH, T4TOTAL, T3FREE, THYROIDAB in the last 72 hours.  Invalid input(s): FREET3 Anemia Panel: No results for input(s): VITAMINB12, FOLATE, FERRITIN, TIBC, IRON, RETICCTPCT in the last 72 hours.   Physical Exam: Blood pressure (!) 100/44, pulse 70, temperature 97.8 F (36.6 C), temperature source Oral, resp. rate 18,  height 5\' 11"  (1.803 m), weight 118.5 kg (261 lb 3.2 oz), SpO2 100 %.   Wt Readings from Last 1 Encounters:  10/06/17 118.5 kg (261 lb 3.2 oz)     General appearance: alert and cooperative Resp: rhonchi bilaterally Cardio: irregularly irregular rhythm GI: soft, non-tender; bowel sounds normal; no masses,  no organomegaly Extremities: edema 4+ lower extremety edema Neurologic: Grossly normal  TELEMETRY: Reviewed telemetry pt in afib with controlled vr:  ASSESSMENT AND PLAN:  Active Problems:   Acute respiratory failure with hypoxia (HCC)-still sob with peripheral edema. Echo showed preserved lv function. Will need to follow renal function with diuresis. Creatinine mildly elevated. Will need evaluation for sleep apnea.   afib-continue apixiban and metoprolol    Dalia Heading, MD, [facility] 10/06/2017 4:36 PM

## 2017-10-06 NOTE — Care Management Note (Signed)
Case Management Note  Patient Details  Name: Gilbert Patrick MRN: [id number] Date of Birth: 1940-11-06  Subjective/Objective:              Patient lives at home with his son.  His son and grand daughter assist with errands and MD appointments.  Patient says he has chronic home oxygen and it sounds as though he has a portable oxygen concentrator.  He has had this about two years but does not know the name of the company.  Contacted [employer].  Patient has scales but does not weigh every day.  He says "I hear I am going to have too "he admits to not taking his meds exactly right  and does not give reason.  He does not seem interested in conversation. Closes eyes and minimal eye contact. He is agreeable to have home health nurse "if it does not cost me anything." No agency preference. Patient may also benefit from [facility] but when attempted to enter referral receive message that patient is not eligible.  Son will transport home and instructed patient to have portable oxygen brought to hospital for transport home.  Says he has a walker. Denies issues with paying for medications.        Action/Plan:  Anticipate home health nurse referral for CHF management  Expected Discharge Date:  10/06/17               Expected Discharge Plan:     In-House Referral:     Discharge planning Services     Post Acute Care Choice:    Choice offered to:     DME Arranged:    DME Agency:     HH Arranged:    HH Agency:     Status of Service:     If discussed at [employer], dates discussed:    Additional Comments:  Eber Hong, RN 10/06/2017, 4:45 PM

## 2017-10-06 NOTE — Progress Notes (Signed)
[facility] [place] [facility]   PATIENT NAME: Gilbert Patrick    MR#:  [id number]  DATE OF BIRTH:  06/15/1941  SUBJECTIVE:  CHIEF COMPLAINT:    REVIEW OF SYSTEMS:  CONSTITUTIONAL: No fever, fatigue or weakness.  EYES: No blurred or double vision.  EARS, NOSE, AND THROAT: No tinnitus or ear pain.  RESPIRATORY: No cough, shortness of breath, wheezing or hemoptysis.  CARDIOVASCULAR: No chest pain, orthopnea, edema.  GASTROINTESTINAL: No nausea, vomiting, diarrhea or abdominal pain.  GENITOURINARY: No dysuria, hematuria.  ENDOCRINE: No polyuria, nocturia,  HEMATOLOGY: No anemia, easy bruising or bleeding SKIN: No rash or lesion. MUSCULOSKELETAL: No joint pain or arthritis.   NEUROLOGIC: No tingling, numbness, weakness.  PSYCHIATRY: No anxiety or depression.   DRUG ALLERGIES:   Allergies  Allergen Reactions  . Penicillins Rash    VITALS:  Blood pressure 127/77, pulse 65, temperature 98 F (36.7 C), temperature source Oral, resp. rate 18, height 5\' 11"  (1.803 m), weight 118.5 kg (261 lb 3.2 oz), SpO2 99 %.  PHYSICAL EXAMINATION:  GENERAL:  76 y.o.-year-old patient lying in the bed with no acute distress.  EYES: Pupils equal, round, reactive to light and accommodation. No scleral icterus. Extraocular muscles intact.  HEENT: Head atraumatic, normocephalic. Oropharynx and nasopharynx clear.  NECK:  Supple, no jugular venous distention. No thyroid enlargement, no tenderness.  LUNGS: Normal breath sounds bilaterally, no wheezing, rales,rhonchi or crepitation. No use of accessory muscles of respiration.  CARDIOVASCULAR: S1, S2 normal. No murmurs, rubs, or gallops.  ABDOMEN: Soft, nontender, nondistended. Bowel sounds present. No organomegaly or mass.  EXTREMITIES: No pedal edema, cyanosis, or clubbing.  NEUROLOGIC: Cranial nerves II through XII are intact. Muscle strength 5/5 in all extremities. Sensation intact. Gait not checked.  PSYCHIATRIC: The  patient is alert and oriented x 3.  SKIN: No obvious rash, lesion, or ulcer.    LABORATORY PANEL:   CBC Recent Labs  Lab 10/05/17 1059  WBC 6.0  HGB 12.1*  HCT 38.2*  PLT 198   ------------------------------------------------------------------------------------------------------------------  Chemistries  Recent Labs  Lab 10/05/17 1425 10/06/17 0542  NA  --  138  K  --  4.1  CL  --  101  CO2  --  33*  GLUCOSE  --  155*  BUN  --  25*  CREATININE  --  1.34*  CALCIUM  --  8.5*  MG 2.1  --    ------------------------------------------------------------------------------------------------------------------  Cardiac Enzymes Recent Labs  Lab 10/05/17 1700  TROPONINI 0.31*   ------------------------------------------------------------------------------------------------------------------  RADIOLOGY:  Dg Chest 1 View  Result Date: 10/05/2017 CLINICAL DATA:  Increasing shortness of breath. Nonproductive cough. Wheezing. EXAM: CHEST 1 VIEW COMPARISON:  06/28/2017 FINDINGS: There is slight haziness at the lung bases which I suspect represents mild pulmonary edema. Overall heart size and pulmonary vascularity are normal. 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  Aortic atherosclerosis. No acute bone abnormality. IMPRESSION: Slight bibasilar haziness, left greater than right which I suspect represents mild pulmonary edema. Electronically Signed   By: Francene BoyersJames  Maxwell M.D.   On: 10/05/2017 11:46    EKG:   Orders placed or performed during the hospital encounter of 10/05/17  . ED EKG  . ED EKG  . EKG 12-Lead  . EKG 12-Lead  . EKG 12-Lead  . EKG 12-Lead  . EKG 12-Lead  . EKG 12-Lead  . EKG    ASSESSMENT AND PLAN:   Acute respiratory failure with hypoxia due to CHF. The patient is  feeling little better today Continue oxygen by nasal cannula,  wean off as tolerated NEB as needed.  Acute on chronic CHF, unknown type. Will get echocardiogram Continue Lasix  40 mg IV  twice a day Daily weights, intake and output CHF protocol Cardiology is following appreciate dr.Faths recommendations Outpatient sleep study   Elevated troponin due to demanding ischemia. Continue Eliquis and follow-up Dr. Gwen Pounds.  History of chronic A. Fib with RVR Continue Eliquis and Toprol.  Hypertension, accelerated. Continue Lasix, Toprol and losartan, IV hydralazine as needed.  CAD, continue aspirin and Pravachol. Diabetes.  Start sliding scale.   PT consult    All the records are reviewed and case discussed with Care Management/Social Workerr. Management plans discussed with the patient, family and they are in agreement.  CODE STATUS: fc   TOTAL TIME TAKING CARE OF THIS PATIENT: .   POSSIBLE D/C IN 1-2  DAYS, DEPENDING ON CLINICAL CONDITION.  Note: This dictation was prepared with Dragon dictation along with smaller phrase technology. Any transcriptional errors that result from this process are unintentional.   Ramonita Lab M.D on 10/06/2017 at 3:36 PM  Between 7am to 6pm - Pager - [phone] After 6pm go to [email or web address] - password [email or web address] [facility]  [facility]  Office  [phone]  CC: Primary care physician; Dortha Kern, MD

## 2017-10-07 LAB — BASIC METABOLIC PANEL
Anion gap: 4 — ABNORMAL LOW (ref 5–15)
BUN: 31 mg/dL — AB (ref 6–20)
CHLORIDE: 100 mmol/L — AB (ref 101–111)
CO2: 34 mmol/L — AB (ref 22–32)
CREATININE: 1.46 mg/dL — AB (ref 0.61–1.24)
Calcium: 8.4 mg/dL — ABNORMAL LOW (ref 8.9–10.3)
GFR calc Af Amer: 52 mL/min — ABNORMAL LOW (ref >60)
GFR calc non Af Amer: 45 mL/min — ABNORMAL LOW (ref >60)
Glucose, Bld: 142 mg/dL — ABNORMAL HIGH (ref 65–99)
POTASSIUM: 4.2 mmol/L (ref 3.5–5.1)
Sodium: 138 mmol/L (ref 135–145)

## 2017-10-07 LAB — GLUCOSE, CAPILLARY
GLUCOSE-CAPILLARY: 139 mg/dL — AB (ref 65–99)
Glucose-Capillary: 150 mg/dL — ABNORMAL HIGH (ref 65–99)
Glucose-Capillary: 133 mg/dL — ABNORMAL HIGH (ref 65–99)
Glucose-Capillary: 207 mg/dL — ABNORMAL HIGH (ref 65–99)

## 2017-10-07 MED ORDER — PREMIER PROTEIN SHAKE
11.0000 [oz_av] | Freq: Two times a day (BID) | ORAL | Status: DC
  Administered 2017-10-07 – 2017-10-08 (×3): 11 [oz_av] via ORAL

## 2017-10-07 MED ORDER — FUROSEMIDE 40 MG PO TABS
40.0000 mg | ORAL_TABLET | Freq: Every day | ORAL | Status: DC
  Administered 2017-10-07 – 2017-10-08 (×2): 40 mg via ORAL
  Filled 2017-10-07 (×2): qty 1

## 2017-10-07 NOTE — Progress Notes (Signed)
MD Sheryle Hail made aware that pt had a 1.9 sec pause. Will continue to monitor.

## 2017-10-07 NOTE — Care Management Important Message (Signed)
Important Message  Patient Details  Name: Gilbert Patrick MRN: [id number] Date of Birth: 12-Dec-1940   Medicare Important Message Given:  Yes  Signed IM notice given   Eber Hong, RN 10/07/2017, 9:43 AM

## 2017-10-07 NOTE — Care Management (Signed)
Found that patient's oxygen is provided by [employer].  Physical therapy is recommending home health.  Patient may also benefit from OT for energy conservation. He is agreeable and heads up referral to [facility] for SN PT OT

## 2017-10-07 NOTE — Consult Note (Signed)
Provided patient with "Living Better with Heart Failure" packet. Briefly reviewed definition of heart failure and signs and symptoms of an exacerbation. Reviewed importance of and reason behind checking weight daily in the AM, after using the bathroom, but before getting dressed. Discussed when to call the Dr= weight gain of >2lb overnight of 5lb in a week,  Discussed yellow zone= call MD: weight gain of >2lb overnight of 5lb in a week, increased swelling, increased SOB when lying down, chest discomfort, dizziness, increased fatigue Red Zone= call 911: struggle to breath, fainting or near fainting, significant chest pain Reviewed low sodium diet <2g/day-provided handout of recommended and not recommended foods  Fluid restriction <2L/day Reviewed how to read nutrition label Reviewed medication changes: Explained briefly why pt is on the medications (either make you feel better, live longer or keep you out of the hospital) and discussed monitoring and side effects Metoprolol, losartan, lasix  Pt was not taking losartan bc he thought he wasn't supposed to be on both bc they were both for BP. Explained why he needed to be on both Also reviewed apixaban w/ patient  Olene Floss, Pharm.D, BCPS Clinical Pharmacist

## 2017-10-07 NOTE — Progress Notes (Signed)
[facility] [place] at [facility]   PATIENT NAME: Gilbert Patrick    MR#:  [id number]  DATE OF BIRTH:  01-06-1941  SUBJECTIVE:  CHIEF COMPLAINT: Patient's shortness of breath is relatively better feeling very weak.  Family members at bedside,  REVIEW OF SYSTEMS:  CONSTITUTIONAL: No fever, fatigue or weakness.  EYES: No blurred or double vision.  EARS, NOSE, AND THROAT: No tinnitus or ear pain.  RESPIRATORY: No cough, improving shortness of breath, denies wheezing or hemoptysis.  CARDIOVASCULAR: No chest pain, orthopnea, edema.  GASTROINTESTINAL: No nausea, vomiting, diarrhea or abdominal pain.  GENITOURINARY: No dysuria, hematuria.  ENDOCRINE: No polyuria, nocturia,  HEMATOLOGY: No anemia, easy bruising or bleeding SKIN: No rash or lesion. MUSCULOSKELETAL: No joint pain or arthritis.   NEUROLOGIC: No tingling, numbness, weakness.  PSYCHIATRY: No anxiety or depression.   DRUG ALLERGIES:   Allergies  Allergen Reactions  . Penicillins Rash    VITALS:  Blood pressure (!) 139/96, pulse 71, temperature 97.6 F (36.4 C), temperature source Oral, resp. rate 16, height 5\' 11"  (1.803 m), weight 117.8 kg (259 lb 9.6 oz), SpO2 96 %.  PHYSICAL EXAMINATION:  GENERAL:  76 y.o.-year-old patient lying in the bed with no acute distress.  EYES: Pupils equal, round, reactive to light and accommodation. No scleral icterus. Extraocular muscles intact.  HEENT: Head atraumatic, normocephalic. Oropharynx and nasopharynx clear.  NECK:  Supple, no jugular venous distention. No thyroid enlargement, no tenderness.  LUNGS: mod has breath sounds bilaterally, no wheezing, has rales,rhonchi or crepitation. No use of accessory muscles of respiration.  CARDIOVASCULAR: S1, S2 normal. No murmurs, rubs, or gallops.  ABDOMEN: Soft, nontender, nondistended. Bowel sounds present. No organomegaly or mass.  EXTREMITIES: No pedal edema, cyanosis, or clubbing.  NEUROLOGIC: Cranial nerves II  through XII are intact. Muscle strength 5/5 in all extremities. Sensation intact. Gait not checked.  PSYCHIATRIC: The patient is alert and oriented x 3.  SKIN: No obvious rash, lesion, or ulcer.    LABORATORY PANEL:   CBC Recent Labs  Lab 10/05/17 1059  WBC 6.0  HGB 12.1*  HCT 38.2*  PLT 198   ------------------------------------------------------------------------------------------------------------------  Chemistries  Recent Labs  Lab 10/05/17 1425  10/07/17 0443  NA  --    < > 138  K  --    < > 4.2  CL  --    < > 100*  CO2  --    < > 34*  GLUCOSE  --    < > 142*  BUN  --    < > 31*  CREATININE  --    < > 1.46*  CALCIUM  --    < > 8.4*  MG 2.1  --   --    < > = values in this interval not displayed.   ------------------------------------------------------------------------------------------------------------------  Cardiac Enzymes Recent Labs  Lab 10/05/17 1700  TROPONINI 0.31*   ------------------------------------------------------------------------------------------------------------------  RADIOLOGY:  No results found.  EKG:   Orders placed or performed during the hospital encounter of 10/05/17  . ED EKG  . ED EKG  . EKG 12-Lead  . EKG 12-Lead  . EKG 12-Lead  . EKG 12-Lead  . EKG 12-Lead  . EKG 12-Lead  . EKG    ASSESSMENT AND PLAN:   Acute respiratory failure with hypoxia due to CHF. The patient is feeling little better today Continue oxygen by nasal cannula,  wean off as tolerated NEB as needed.  Acute on chronic CHF, unknown type.  echocardiogram 55-60%  ejection fraction Continue Lasix 40 mg IV  twice a day Daily weights, intake and output CHF protocol Cardiology is following appreciate dr.Faths recommendations Outpatient sleep study   Elevated troponin due to demanding ischemia. Continue Eliquis and follow-up Dr. Kowalski.  History of chronic A. Fib with RVR Continue Eliquis and Toprol.  HypeGwen Poundsrtension,  accelerated. Continue Lasix, Toprol and losartan, IV hydralazine as needed.  CAD, continue aspirin and Pravachol. Diabetes.  Start sliding scale.   PT consult home health PT-    All the records are reviewed and case discussed with Care Management/Social Workerr. Management plans discussed with the patient, family and they are in agreement.  CODE STATUS: fc   TOTAL TIME TAKING CARE OF THIS PATIENT: 35minutes.   POSSIBLE D/C IN 1-2  DAYS, DEPENDING ON CLINICAL CONDITION.  Note: This dictation was prepared with Dragon dictation along with smaller phrase technology. Any transcriptional errors that result from this process are unintentional.   Ramonita LabGouru, Faith Patricelli M.D on 10/07/2017 at 6:56 PM  Between 7am to 6pm - Pager - [phone] After 6pm go to [email or web address] - password [email or web address] [email or web address]  [facility]  Office  [phone]  CC: Primary care physician; Dortha KernBliss, Laura K, MD

## 2017-10-07 NOTE — Progress Notes (Signed)
Nutrition Education Note  RD consulted for nutrition education regarding new onset CHF.  76 y/o male with acute respiratory failure with hypoxia due to CHF  RD provided "Low Sodium Nutrition Therapy" handout from the Academy of [employer]etics. Reviewed patient's dietary recall. Provided examples on ways to decrease sodium intake in diet. Discouraged intake of processed foods and use of salt shaker. Encouraged fresh fruits and vegetables as well as whole grain sources of carbohydrates to maximize fiber intake.   RD discussed why it is important for patient to adhere to diet recommendations, and emphasized the role of fluids, foods to avoid, and importance of weighing self daily. Teach back method used.  Expect poor compliance.  Body mass index is 36.21 kg/m. Pt meets criteria for obesity based on current BMI.  Current diet order is heart healthy, patient is consuming approximately 100% of meals at this time. Labs and medications reviewed.  RD will order Premier Protein BID, each supplement provides 160 kcal and 30 grams of protein.    No further nutrition interventions warranted at this time. RD contact information provided. If additional nutrition issues arise, please re-consult RD.   Betsey Holiday MS, RD, LDN Pager #- [phone] After Hours Pager: [phone]

## 2017-10-07 NOTE — Evaluation (Signed)
Physical Therapy Evaluation Patient Details Name: Gilbert Patrick MRN: [id number] DOB: 27-Mar-1941 Today's Date: 10/07/2017   History of Present Illness  presented to ER secondary to SOB, LE edema; admitted with acute respiratory failure related to CHF exacerbation.  Noted with mild elevation in troponin, demand ischemia per cards.  Clinical Impression  Upon evaluation, patient alert and oriented; follows all commands and demonstrates fair insight into health condition.  Bilat UE/LE strength and ROM grossly symmetrical and WFL, appropriate for basic transfers and ambulation.  2-3+ pitting edema noted distal LEs.  5x sit/stand completed in 23 seconds, indicating decreased power/endurance of bilat LEs with functional activities (and increased fall risk).  Demonstrates ability to complete sit/stand, basic transfers and short-distance gait (20') with SPC, cga/close sup.  Broad BOS with limited balance reactions/reaction time, but no overt buckling or LOB.  Did desat to 86% on 4L supplemental O2, requiring 30-45 seconds of seated rest for recovery to >90%. Would benefit from skilled PT to address above deficits and promote optimal return to PLOF; Recommend transition to HHPT upon discharge from acute hospitalization.     Follow Up Recommendations Home health PT    Equipment Recommendations       Recommendations for Other Services       Precautions / Restrictions Precautions Precautions: Fall Restrictions Weight Bearing Restrictions: No      Mobility  Bed Mobility               General bed mobility comments: seated on bedside commode beginning of session, edge of bed end of session  Transfers Overall transfer level: Needs assistance Equipment used: Straight cane Transfers: Sit to/from Stand Sit to Stand: Min guard;Supervision         General transfer comment: broad BOS, UE support for lift off  Ambulation/Gait Ambulation/Gait assistance: Min guard;Supervision Ambulation  Distance (Feet): 25 Feet Assistive device: Straight cane       General Gait Details: broad BOS, forward flexed posture; no overt buckling or LOB.  Mod SOB with exertion, desat to 86% requiring 30-45 seconds seated rest and pursed lip breathing for recovery  Stairs            Wheelchair Mobility    Modified Rankin (Stroke Patients Only)       Balance Overall balance assessment: Needs assistance Sitting-balance support: No upper extremity supported;Feet supported Sitting balance-Leahy Scale: Good     Standing balance support: Single extremity supported Standing balance-Leahy Scale: Fair                               Pertinent Vitals/Pain Pain Assessment: No/denies pain    Home Living Family/patient expects to be discharged to:: Private residence Living Arrangements: Children Available Help at Discharge: Family Type of Home: House Home Access: Stairs to enter Entrance Stairs-Rails: (post) Secretary/administrator of Steps: 3 Home Layout: One level Home Equipment: Cane - single point      Prior Function Level of Independence: Independent with assistive device(s)         Comments: Mod indep for ADLs, limited household mobilization; home O2 at 2L.  Does endorse 2 falls within previous year.     Hand Dominance        Extremity/Trunk Assessment   Upper Extremity Assessment Upper Extremity Assessment: Overall WFL for tasks assessed    Lower Extremity Assessment Lower Extremity Assessment: Overall WFL for tasks assessed(grossly 4-/5 throughout bilat LEs; generalized 2-3+ pitting edema distally)  Communication   Communication: No difficulties  Cognition Arousal/Alertness: Awake/alert Behavior During Therapy: WFL for tasks assessed/performed Overall Cognitive Status: Within Functional Limits for tasks assessed                                        General Comments      Exercises Other Exercises Other Exercises:  Reviewed therex, postural control activities to promote postural extension and neutral postural alignment.   Patient able to obtain neutral position but does not maintain without constant cuing   Assessment/Plan    PT Assessment Patient needs continued PT services  PT Problem List Decreased strength;Decreased range of motion;Decreased balance;Decreased activity tolerance;Decreased mobility;Decreased coordination;Decreased safety awareness;Cardiopulmonary status limiting activity;Decreased skin integrity       PT Treatment Interventions DME instruction;Gait training;Stair training;Functional mobility training;Therapeutic activities;Therapeutic exercise;Balance training;Patient/family education    PT Goals (Current goals can be found in the Care Plan section)  Acute Rehab PT Goals Patient Stated Goal: to return home PT Goal Formulation: With patient Time For Goal Achievement: 10/21/17 Potential to Achieve Goals: Good Additional Goals Additional Goal #1: Indep demonstration of energy conservation/activity pacing strategies.    Frequency Min 2X/week   Barriers to discharge        Co-evaluation               AM-PAC PT "6 Clicks" Daily Activity  Outcome Measure Difficulty turning over in bed (including adjusting bedclothes, sheets and blankets)?: A Little Difficulty moving from lying on back to sitting on the side of the bed? : A Little Difficulty sitting down on and standing up from a chair with arms (e.g., wheelchair, bedside commode, etc,.)?: Unable Help needed moving to and from a bed to chair (including a wheelchair)?: A Little Help needed walking in hospital room?: A Little Help needed climbing 3-5 steps with a railing? : A Little 6 Click Score: 16    End of Session Equipment Utilized During Treatment: Gait belt Activity Tolerance: Patient tolerated treatment well Patient left: in bed;with call bell/phone within reach;with family/visitor present Nurse Communication:  Mobility status PT Visit Diagnosis: Muscle weakness (generalized) (M62.81);Difficulty in walking, not elsewhere classified (R26.2)    Time: [id number] PT Time Calculation (min) (ACUTE ONLY): 18 min   Charges:   PT Evaluation $PT Eval Low Complexity: 1 Low PT Treatments $Therapeutic Exercise: 8-22 mins   PT G Codes:   PT G-Codes **NOT FOR INPATIENT CLASS** Functional Assessment Tool Used: AM-PAC 6 Clicks Basic Mobility Functional Limitation: Mobility: Walking and moving around Mobility: Walking and Moving Around Current Status ([id number](G8978): At least 20 percent but less than 40 percent impaired, limited or restricted Mobility: Walking and Moving Around Goal Status [phone](G8979): At least 1 percent but less than 20 percent impaired, limited or restricted   Rosie Golson H. Manson PasseyBrown, PT, DPT, NCS 10/07/17, 1:19 PM [phone]

## 2017-10-08 LAB — BASIC METABOLIC PANEL
Anion gap: 6 (ref 5–15)
BUN: 34 mg/dL — AB (ref 6–20)
CHLORIDE: 97 mmol/L — AB (ref 101–111)
CO2: 35 mmol/L — ABNORMAL HIGH (ref 22–32)
CREATININE: 1.42 mg/dL — AB (ref 0.61–1.24)
Calcium: 8.4 mg/dL — ABNORMAL LOW (ref 8.9–10.3)
GFR calc Af Amer: 54 mL/min — ABNORMAL LOW (ref >60)
GFR calc non Af Amer: 46 mL/min — ABNORMAL LOW (ref >60)
Glucose, Bld: 150 mg/dL — ABNORMAL HIGH (ref 65–99)
POTASSIUM: 4.2 mmol/L (ref 3.5–5.1)
Sodium: 138 mmol/L (ref 135–145)

## 2017-10-08 LAB — GLUCOSE, CAPILLARY
GLUCOSE-CAPILLARY: 229 mg/dL — AB (ref 65–99)
Glucose-Capillary: 139 mg/dL — ABNORMAL HIGH (ref 65–99)
Glucose-Capillary: 170 mg/dL — ABNORMAL HIGH (ref 65–99)
Glucose-Capillary: 183 mg/dL — ABNORMAL HIGH (ref 65–99)

## 2017-10-08 MED ORDER — FUROSEMIDE 10 MG/ML IJ SOLN
40.0000 mg | Freq: Two times a day (BID) | INTRAMUSCULAR | Status: DC
  Administered 2017-10-08: 40 mg via INTRAVENOUS
  Filled 2017-10-08: qty 4

## 2017-10-08 NOTE — Plan of Care (Signed)
  Progressing Education: Knowledge of General Education information will improve 10/08/2017 0229 - Progressing by Dorna Leitz, RN Activity: Risk for activity intolerance will decrease 10/08/2017 0229 - Progressing by Dorna Leitz, RN Safety: Ability to remain free from injury will improve 10/08/2017 0229 - Progressing by Dorna Leitz, RN

## 2017-10-08 NOTE — Progress Notes (Signed)
CHF Patient.  Rounded on patient.   Patient reported that someone had been in already to educate him on HF.  This RN reviewed the 5 steps to Living Better with Heart Failure.  Patient reported that he has scales and admitted that apparently he has not been weighing himself often enough.  Reviewed the three HF Zones and actions for each.    Informed patient of the [facility].  Patient does not want to be seen in the [facility] as Dr. Quillian Quince is able to see him whenever he needs to be seen.  In fact, patient reported she is the reason he is in the hospital right now.  Patient stated, "I do not see any need to drive all the way here from [facility] when Dr. Quillian Quince is in [facility] and I can see her anytime I need to do so."    Patient had no further questions about heart failure.  He thanked me for coming by.    Army Melia, RN, BSN, [facility] Pulmonary Nurse Navigator

## 2017-10-08 NOTE — Plan of Care (Signed)
  Progressing Education: Ability to demonstrate management of disease process will improve 10/08/2017 2309 - Progressing by Dorna Leitz, RN Ability to verbalize understanding of medication therapies will improve 10/08/2017 2309 - Progressing by Dorna Leitz, RN Activity: Capacity to carry out activities will improve 10/08/2017 2309 - Progressing by Dorna Leitz, RN Cardiac: Ability to achieve and maintain adequate cardiopulmonary perfusion will improve 10/08/2017 2309 - Progressing by Dorna Leitz, RN

## 2017-10-08 NOTE — Progress Notes (Signed)
CCMD notified RN that patient had 4 beats of VTach. Patient resting comfortably in bed. MD Sheryle Hail made aware. No new orders at this time.

## 2017-10-08 NOTE — Progress Notes (Signed)
SUBJECTIVE: Patient is less short of breath but still has some swelling of the legs   Vitals:   10/07/17 1935 10/08/17 0306 10/08/17 0732 10/08/17 0923  BP: 116/62 (!) 114/51 115/67 (!) 117/45  Pulse: 71 79 74 63  Resp: 16 18 18    Temp: 97.7 F (36.5 C) 98.6 F (37 C) 98 F (36.7 C)   TempSrc: Oral Oral Oral   SpO2: 98% 96% 92% 94%  Weight:  258 lb (117 kg)    Height:        Intake/Output Summary (Last 24 hours) at 10/08/2017 1217 Last data filed at 10/08/2017 1020 Gross per 24 hour  Intake 720 ml  Output 2300 ml  Net -1580 ml    LABS: Basic Metabolic Panel: Recent Labs    10/05/17 1425  10/07/17 0443 10/08/17 0530  NA  --    < > 138 138  K  --    < > 4.2 4.2  CL  --    < > 100* 97*  CO2  --    < > 34* 35*  GLUCOSE  --    < > 142* 150*  BUN  --    < > 31* 34*  CREATININE  --    < > 1.46* 1.42*  CALCIUM  --    < > 8.4* 8.4*  MG 2.1  --   --   --    < > = values in this interval not displayed.   Liver Function Tests: No results for input(s): AST, ALT, ALKPHOS, BILITOT, PROT, ALBUMIN in the last 72 hours. No results for input(s): LIPASE, AMYLASE in the last 72 hours. CBC: No results for input(s): WBC, NEUTROABS, HGB, HCT, MCV, PLT in the last 72 hours. Cardiac Enzymes: Recent Labs    10/05/17 1425 10/05/17 1700  TROPONINI 0.32* 0.31*   BNP: Invalid input(s): POCBNP D-Dimer: No results for input(s): DDIMER in the last 72 hours. Hemoglobin A1C: Recent Labs    10/05/17 1425  HGBA1C 6.8*   Fasting Lipid Panel: No results for input(s): CHOL, HDL, LDLCALC, TRIG, CHOLHDL, LDLDIRECT in the last 72 hours. Thyroid Function Tests: No results for input(s): TSH, T4TOTAL, T3FREE, THYROIDAB in the last 72 hours.  Invalid input(s): FREET3 Anemia Panel: No results for input(s): VITAMINB12, FOLATE, FERRITIN, TIBC, IRON, RETICCTPCT in the last 72 hours.   PHYSICAL EXAM General: Well developed, well nourished, in no acute distress HEENT:  Normocephalic and  atramatic Neck:  No JVD.  Lungs: Clear bilaterally to auscultation and percussion. Heart: HRRR . Normal S1 and S2 without gallops or murmurs.  Abdomen: Bowel sounds are positive, abdomen soft and non-tender  Msk:  Back normal, normal gait. Normal strength and tone for age. Extremities: No clubbing, cyanosis or edema.   Neuro: Alert and oriented X 3. Psych:  Good affect, responds appropriately  TELEMETRY: Sinus rhythm  ASSESSMENT AND PLAN: Congestive heart failure which has significantly improved  since admission but swelling of the leg is still 2+. Advise increasing the Lasix and keeping the patient here and other day to reassess in the morning if the swelling has subsided.  Active Problems:   Acute respiratory failure with hypoxia (HCC)    Adrian Blackwater A, MD, [facility] 10/08/2017 12:17 PM

## 2017-10-08 NOTE — Progress Notes (Signed)
[facility] [place] at [facility]   PATIENT NAME: Gilbert Patrick    MR#:  [id number]  DATE OF BIRTH:  04/05/1941  SUBJECTIVE:  CHIEF COMPLAINT: Patient's shortness of breath is relatively better , patient still has significant lower extremity swelling   REVIEW OF SYSTEMS:  CONSTITUTIONAL: No fever, fatigue or weakness.  EYES: No blurred or double vision.  EARS, NOSE, AND THROAT: No tinnitus or ear pain.  RESPIRATORY: No cough, improving shortness of breath, denies wheezing or hemoptysis.  CARDIOVASCULAR: No chest pain, orthopnea, edema.  GASTROINTESTINAL: No nausea, vomiting, diarrhea or abdominal pain.  GENITOURINARY: No dysuria, hematuria.  ENDOCRINE: No polyuria, nocturia,  HEMATOLOGY: No anemia, easy bruising or bleeding SKIN: No rash or lesion. MUSCULOSKELETAL: No joint pain or arthritis.   NEUROLOGIC: No tingling, numbness, weakness.  PSYCHIATRY: No anxiety or depression.   DRUG ALLERGIES:   Allergies  Allergen Reactions  . Penicillins Rash    VITALS:  Blood pressure (!) 101/43, pulse 66, temperature 98.1 F (36.7 C), resp. rate 18, height 5\' 11"  (1.803 m), weight 117 kg (258 lb), SpO2 94 %.  PHYSICAL EXAMINATION:  GENERAL:  76 y.o.-year-old patient lying in the bed with no acute distress.  EYES: Pupils equal, round, reactive to light and accommodation. No scleral icterus. Extraocular muscles intact.  HEENT: Head atraumatic, normocephalic. Oropharynx and nasopharynx clear.  NECK:  Supple, no jugular venous distention. No thyroid enlargement, no tenderness.  LUNGS: mod has breath sounds bilaterally, no wheezing, has rales,rhonchi or crepitation. No use of accessory muscles of respiration.  CARDIOVASCULAR: S1, S2 normal. No murmurs, rubs, or gallops.  ABDOMEN: Soft, nontender, nondistended. Bowel sounds present. No organomegaly or mass.  EXTREMITIES: 2-3 + pedal edema, No cyanosis, or clubbing.  NEUROLOGIC: Cranial nerves II through XII are  intact. Muscle strength 5/5 in all extremities. Sensation intact. Gait not checked.  PSYCHIATRIC: The patient is alert and oriented x 3.  SKIN: No obvious rash, lesion, or ulcer.    LABORATORY PANEL:   CBC Recent Labs  Lab 10/05/17 1059  WBC 6.0  HGB 12.1*  HCT 38.2*  PLT 198   ------------------------------------------------------------------------------------------------------------------  Chemistries  Recent Labs  Lab 10/05/17 1425  10/08/17 0530  NA  --    < > 138  K  --    < > 4.2  CL  --    < > 97*  CO2  --    < > 35*  GLUCOSE  --    < > 150*  BUN  --    < > 34*  CREATININE  --    < > 1.42*  CALCIUM  --    < > 8.4*  MG 2.1  --   --    < > = values in this interval not displayed.   ------------------------------------------------------------------------------------------------------------------  Cardiac Enzymes Recent Labs  Lab 10/05/17 1700  TROPONINI 0.31*   ------------------------------------------------------------------------------------------------------------------  RADIOLOGY:  No results found.  EKG:   Orders placed or performed during the hospital encounter of 10/05/17  . ED EKG  . ED EKG  . EKG 12-Lead  . EKG 12-Lead  . EKG 12-Lead  . EKG 12-Lead  . EKG 12-Lead  . EKG 12-Lead  . EKG    ASSESSMENT AND PLAN:   Acute respiratory failure with hypoxia due to CHF. The patient is feeling  better today Continue oxygen by nasal cannula,  wean off as tolerated NEB as needed.  Acute on chronic CHF, unknown type.  echocardiogram 55-60% ejection fraction  Continue Lasix 40 mg IV  twice a day Daily weights, intake and output CHF protocol Cardiology is following appreciate dr.Khans ( covering for dr Lady GaryFath) recommendations Outpatient sleep study   Elevated troponin due to demanding ischemia. Continue Eliquis and follow-up Dr. Gwen PoundsKowalski.  History of chronic A. Fib with RVR Continue Eliquis and Toprol.  Hypertension,  accelerated. Continue Lasix, Toprol and losartan, IV hydralazine as needed.  CAD, continue aspirin and Pravachol. Diabetes.  Start sliding scale.   PT consult home health PT-    All the records are reviewed and case discussed with Care Management/Social Workerr. Management plans discussed with the patient, family and they are in agreement.  CODE STATUS: fc   TOTAL TIME TAKING CARE OF THIS PATIENT: 35minutes.   POSSIBLE D/C IN 1-  DAYS, DEPENDING ON CLINICAL CONDITION.  Note: This dictation was prepared with Dragon dictation along with smaller phrase technology. Any transcriptional errors that result from this process are unintentional.   Ramonita LabGouru, Keionna Kinnaird M.D on 10/08/2017 at 7:55 PM  Between 7am to 6pm - Pager - [phone] After 6pm go to [email or web address] - password [email or web address]  [facility]  Office  [phone]  CC: Primary care physician; Dortha KernBliss, Laura K, MD

## 2017-10-08 NOTE — Progress Notes (Signed)
Patient heart rate jumped in 40-45s. Did not sustain. Upon assessment, Patient was resting comfortably. Patient slighty agitated with staff when he was awoken. This RN educated the patient on why he was being awoken. Patient verbalizes agreement, and went back to sleep. Will continue to monitor.   Gilbert Patrick M

## 2017-10-09 LAB — GLUCOSE, CAPILLARY
GLUCOSE-CAPILLARY: 183 mg/dL — AB (ref 65–99)
Glucose-Capillary: 145 mg/dL — ABNORMAL HIGH (ref 65–99)

## 2017-10-09 MED ORDER — POTASSIUM CHLORIDE ER 10 MEQ PO TBCR: 10.0000 meq | EXTENDED_RELEASE_TABLET | Freq: Every day | ORAL | 0 refills | Status: AC

## 2017-10-09 MED ORDER — PREMIER PROTEIN SHAKE: 11.0000 [oz_av] | Freq: Two times a day (BID) | ORAL | 0 refills | Status: AC

## 2017-10-09 MED ORDER — SALINE SPRAY 0.65 % NA SOLN
1.0000 | NASAL | Status: DC | PRN
  Filled 2017-10-09 (×2): qty 44

## 2017-10-09 MED ORDER — SENNOSIDES-DOCUSATE SODIUM 8.6-50 MG PO TABS
1.0000 | ORAL_TABLET | Freq: Every evening | ORAL | Status: AC | PRN
Start: 2017-10-09 — End: ?

## 2017-10-09 MED ORDER — FUROSEMIDE 40 MG PO TABS: 40.0000 mg | ORAL_TABLET | Freq: Two times a day (BID) | ORAL | Status: DC

## 2017-10-09 MED ORDER — FUROSEMIDE 20 MG PO TABS
40.0000 mg | ORAL_TABLET | Freq: Two times a day (BID) | ORAL | 0 refills | Status: AC
Start: 2017-10-09 — End: ?

## 2017-10-09 NOTE — Progress Notes (Signed)
SUBJECTIVE: patient is feeling much better wit significantly decreased   Vitals:   10/08/17 1917 10/08/17 2336 10/09/17 0336 10/09/17 0824  BP: (!) 101/43 (!) 136/57 (!) 138/58 (!) 146/76  Pulse: 66 (!) 54 62 80  Resp: 18  18 18   Temp: 98.1 F (36.7 C)  97.8 F (36.6 C) 98.1 F (36.7 C)  TempSrc:    Oral  SpO2: 94%  100% 98%  Weight:   257 lb (116.6 kg)   Height:        Intake/Output Summary (Last 24 hours) at 10/09/2017 1141 Last data filed at 10/09/2017 1101 Gross per 24 hour  Intake 720 ml  Output 3150 ml  Net -2430 ml    LABS: Basic Metabolic Panel: Recent Labs    10/07/17 0443 10/08/17 0530  NA 138 138  K 4.2 4.2  CL 100* 97*  CO2 34* 35*  GLUCOSE 142* 150*  BUN 31* 34*  CREATININE 1.46* 1.42*  CALCIUM 8.4* 8.4*   Liver Function Tests: No results for input(s): AST, ALT, ALKPHOS, BILITOT, PROT, ALBUMIN in the last 72 hours. No results for input(s): LIPASE, AMYLASE in the last 72 hours. CBC: No results for input(s): WBC, NEUTROABS, HGB, HCT, MCV, PLT in the last 72 hours. Cardiac Enzymes: No results for input(s): CKTOTAL, CKMB, CKMBINDEX, TROPONINI in the last 72 hours. BNP: Invalid input(s): POCBNP D-Dimer: No results for input(s): DDIMER in the last 72 hours. Hemoglobin A1C: No results for input(s): HGBA1C in the last 72 hours. Fasting Lipid Panel: No results for input(s): CHOL, HDL, LDLCALC, TRIG, CHOLHDL, LDLDIRECT in the last 72 hours. Thyroid Function Tests: No results for input(s): TSH, T4TOTAL, T3FREE, THYROIDAB in the last 72 hours.  Invalid input(s): FREET3 Anemia Panel: No results for input(s): VITAMINB12, FOLATE, FERRITIN, TIBC, IRON, RETICCTPCT in the last 72 hours.   PHYSICAL EXAM General: Well developed, well nourished, in no acute distress HEENT:  Normocephalic and atramatic Neck:  No JVD.  Lungs: Clear bilaterally to auscultation and percussion. Heart: HRRR . Normal S1 and S2 without gallops or murmurs.  Abdomen: Bowel sounds  are positive, abdomen soft and non-tender  Msk:  Back normal, normal gait. Normal strength and tone for age. Extremities: No clubbing, cyanosis or edema.   Neuro: Alert and oriented X 3. Psych:  Good affect, responds appropriately  TELEMETRY: sinus rhythm  ASSESSMENT AND PLAN: congestive heart failure with hypoxia and now significantly improved . Continue current medications and follow-up with [facility] cardiology.  Active Problems:   Acute respiratory failure with hypoxia (HCC)    KHAN,SHAUKAT A, MD, [facility] 10/09/2017 11:41 AM

## 2017-10-09 NOTE — Care Management (Signed)
Patient in agreement with home health at discharge.  obtained order for SN PT OT.  Notified [facility].

## 2017-10-09 NOTE — Progress Notes (Signed)
Pt complains of a stopped up nose. RN will place order for saline nose drops per standing orders. I will continue to assess.

## 2017-10-09 NOTE — Discharge Instructions (Signed)
Follow-up with primary care physician in a week Follow-up with cardiology in a week Follow-up with CHF clinic in 2-3 days Continue 2-3 L of oxygen via nasal cannula Monitor daily weights

## 2017-10-09 NOTE — Discharge Summary (Signed)
[facility] - [place] at [facility]   PATIENT NAME: Gilbert Patrick    MR#:  [id number]  DATE OF BIRTH:  02-Feb-1941  DATE OF ADMISSION:  10/05/2017 ADMITTING PHYSICIAN: Shaune Pollack, MD  DATE OF DISCHARGE: 10/09/17  PRIMARY CARE PHYSICIAN: Dortha Kern, MD    ADMISSION DIAGNOSIS:  Acute on chronic congestive heart failure, unspecified heart failure type (HCC) [I50.9]  DISCHARGE DIAGNOSIS:  Active Problems:   Acute respiratory failure with hypoxia (HCC)   SECONDARY DIAGNOSIS:   Past Medical History:  Diagnosis Date  . A-fib (HCC)   . CAD (coronary artery disease)   . CHF (congestive heart failure) (HCC)   . Diabetes mellitus without complication (HCC)   . Hypertension     HOSPITAL COURSE:  HPI  Gilbert Patrick  is a 76 y.o. male with a known history of CHF, CAD, A. fib on Eliquis, diabetes and hypertension.  The patient presented the ED with above chief complaints.  He has had a worsening cough, shortness breath and leg swelling for the past several days.  He was found hypoxia with O2 saturation 80s on oxygen by nasal cannula.  He was found to have elevated BNP and troponin.  Chest x-ray show pulmonary edema.   Acute respiratory failure with hypoxia due to CHF. The patient is feeling  better today Continue oxygen by nasal cannula, wean off as tolerated NEBas needed.  Acute on chronic CHF, unknown type.  echocardiogram 55-60% ejection fraction Clinically improved with Lasix 40 mg IV  twice a day, changed to by mouth Lasix Daily weights, intake and output CHF protocol, outpatient sleep clinic follow-up Cardiology is following appreciate dr.Khans ( covering for dr Lady Gary) recommendations Outpatient sleep study   Elevated troponin due todemanding ischemia. Continue Eliquisandfollow-up Dr. Gwen Pounds.  History of chronic A. Fibwith RVR ContinueEliquisand Toprol.  Hypertension, accelerated. Improved Continue Lasix, Toprol and losartan, IV  hydralazine as needed.  CAD,continue aspirin and Pravachol. Diabetes.Start sliding scale.   PT consult home health PT-     DISCHARGE CONDITIONS:   stable  CONSULTS OBTAINED:  Treatment Team:  Dalia Heading, MD Lamar Blinks, MD Laurier Nancy, MD   PROCEDURES  None   DRUG ALLERGIES:   Allergies  Allergen Reactions  . Penicillins Rash    DISCHARGE MEDICATIONS:   Allergies as of 10/09/2017      Reactions   Penicillins Rash      Medication List    TAKE these medications   aspirin EC 81 MG tablet Take 81 mg by mouth daily.   ELIQUIS 5 MG Tabs tablet Generic drug:  apixaban Take 5 mg by mouth 2 (two) times daily.   furosemide 20 MG tablet Commonly known as:  LASIX Take 2 tablets (40 mg total) by mouth 2 (two) times daily. What changed:    how much to take  when to take this   glipiZIDE 10 MG tablet Commonly known as:  GLUCOTROL Take 10 mg by mouth 2 (two) times daily before a meal.   losartan 100 MG tablet Commonly known as:  COZAAR Take 100 mg by mouth daily.   metoprolol succinate 50 MG 24 hr tablet Commonly known as:  TOPROL-XL Take 50 mg by mouth daily. Take with or immediately following a meal.   potassium chloride 10 MEQ tablet Commonly known as:  K-DUR Take 1 tablet (10 mEq total) by mouth daily.   pravastatin 40 MG tablet Commonly known as:  PRAVACHOL Take 40 mg by mouth daily.  protein supplement shake Liqd Commonly known as:  PREMIER PROTEIN Take 325 mLs (11 oz total) by mouth 2 (two) times daily between meals.   senna-docusate 8.6-50 MG tablet Commonly known as:  Senokot-S Take 1 tablet by mouth at bedtime as needed for mild constipation.   sitaGLIPtin 100 MG tablet Commonly known as:  JANUVIA Take 100 mg by mouth daily.        DISCHARGE INSTRUCTIONS:   Follow-up with primary care physician in a week Follow-up with cardiology in a week Follow-up with CHF clinic in 2-3 days Continue 2-3 L of oxygen  via nasal cannula Monitor daily weights   DIET:  Cardiac diet  DISCHARGE CONDITION:  Stable  ACTIVITY:  Activity as tolerated  OXYGEN:  Home Oxygen: Yes.     Oxygen Delivery: 3 liters/min via Patient connected to nasal cannula oxygen  DISCHARGE LOCATION:  home   If you experience worsening of your admission symptoms, develop shortness of breath, life threatening emergency, suicidal or homicidal thoughts you must seek medical attention immediately by calling 911 or calling your MD immediately  if symptoms less severe.  You Must read complete instructions/literature along with all the possible adverse reactions/side effects for all the Medicines you take and that have been prescribed to you. Take any new Medicines after you have completely understood and accpet all the possible adverse reactions/side effects.   Please note  You were cared for by a hospitalist during your hospital stay. If you have any questions about your discharge medications or the care you received while you were in the hospital after you are discharged, you can call the unit and asked to speak with the hospitalist on call if the hospitalist that took care of you is not available. Once you are discharged, your primary care physician will handle any further medical issues. Please note that NO REFILLS for any discharge medications will be authorized once you are discharged, as it is imperative that you return to your primary care physician (or establish a relationship with a primary care physician if you do not have one) for your aftercare needs so that they can reassess your need for medications and monitor your lab values.     Today  Chief Complaint  Patient presents with  . Shortness of Breath   'Patient s and shortness of breath is better swelling in his feet is better too  ROS:  CONSTITUTIONAL: Denies fevers, chills. Denies any fatigue, weakness.  EYES: Denies blurry vision, double vision, eye  pain. EARS, NOSE, THROAT: Denies tinnitus, ear pain, hearing loss. RESPIRATORY: Denies cough, wheeze, shortness of breath.  CARDIOVASCULAR: Denies chest pain, palpitations, edema.  GASTROINTESTINAL: Denies nausea, vomiting, diarrhea, abdominal pain. Denies bright red blood per rectum. GENITOURINARY: Denies dysuria, hematuria. ENDOCRINE: Denies nocturia or thyroid problems. HEMATOLOGIC AND LYMPHATIC: Denies easy bruising or bleeding. SKIN: Denies rash or lesion. MUSCULOSKELETAL: Denies pain in neck, back, shoulder, knees, hips or arthritic symptoms.  NEUROLOGIC: Denies paralysis, paresthesias.  PSYCHIATRIC: Denies anxiety or depressive symptoms.   VITAL SIGNS:  Blood pressure (!) 146/76, pulse 80, temperature 98.1 F (36.7 C), temperature source Oral, resp. rate 18, height  (1.803 m), weight 116.6 kg (257 lb), SpO2 98 %.  I/O:    Intake/Output Summary (Last 24 hours) at 10/09/2017 1120 Last data filed at 10/09/2017 1101 Gross per 24 hour  Intake 720 ml  Output 3150 ml  Net -2430 ml    PHYSICAL EXAMINATION:  GENERAL:  76 y.o.-year-old patient lying in the bed with  no acute distress.  EYES: Pupils equal, round, reactive to light and accommodation. No scleral icterus. Extraocular muscles intact.  HEENT: Head atraumatic, normocephalic. Oropharynx and nasopharynx clear.  NECK:  Supple, no jugular venous distention. No thyroid enlargement, no tenderness.  LUNGS: Normal breath sounds bilaterally, no wheezing, rales,rhonchi or crepitation. No use of accessory muscles of respiration.  CARDIOVASCULAR: S1, S2 normal. No murmurs, rubs, or gallops.  ABDOMEN: Soft, non-tender, non-distended. Bowel sounds present. No organomegaly or mass.  EXTREMITIES: 1+ pedal edema, cyanosis, or clubbing.  NEUROLOGIC: Cranial nerves II through XII are intact. Muscle strength 5/5 in all extremities. Sensation intact. Gait not checked.  PSYCHIATRIC: The patient is alert and oriented x 3.  SKIN: No  obvious rash, lesion, or ulcer.   DATA REVIEW:   CBC Recent Labs  Lab 10/05/17 1059  WBC 6.0  HGB 12.1*  HCT 38.2*  PLT 198    Chemistries  Recent Labs  Lab 10/05/17 1425  10/08/17 0530  NA  --    < > 138  K  --    < > 4.2  CL  --    < > 97*  CO2  --    < > 35*  GLUCOSE  --    < > 150*  BUN  --    < > 34*  CREATININE  --    < > 1.42*  CALCIUM  --    < > 8.4*  MG 2.1  --   --    < > = values in this interval not displayed.    Cardiac Enzymes Recent Labs  Lab 10/05/17 1700  TROPONINI 0.31*    Microbiology Results  No results found for this or any previous visit.  RADIOLOGY:  Dg Chest 1 View  Result Date: 10/05/2017 CLINICAL DATA:  Increasing shortness of breath. Nonproductive cough. Wheezing. EXAM: CHEST 1 VIEW COMPARISON:  06/28/2017 FINDINGS: There is slight haziness at the lung bases which I suspect represents mild pulmonary edema. Overall heart size and pulmonary vascularity are normal. 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  Aortic atherosclerosis. No acute bone abnormality. IMPRESSION: Slight bibasilar haziness, left greater than right which I suspect represents mild pulmonary edema. Electronically Signed   By: Francene Boyers M.D.   On: 10/05/2017 11:46    EKG:   Orders placed or performed during the hospital encounter of 10/05/17  . ED EKG  . ED EKG  . EKG 12-Lead  . EKG 12-Lead  . EKG 12-Lead  . EKG 12-Lead  . EKG 12-Lead  . EKG 12-Lead  . EKG      Management plans discussed with the patient, family and they are in agreement.  CODE STATUS:     Code Status Orders  (From admission, onward)        Start     Ordered   10/05/17 1405  Full code  Continuous     10/05/17 1404    Code Status History    Date Active Date Inactive Code Status Order ID Comments User Context   This patient has a current code status but no historical code status.      TOTAL TIME TAKING CARE OF THIS PATIENT: 45  minutes.   Note: This dictation  was prepared with Dragon dictation along with smaller phrase technology. Any transcriptional errors that result from this process are unintentional.   @[facility] @  on 10/09/2017 at 11:20 AM  Between 7am to 6pm - Pager - [phone]  After 6pm go to  [email or web address] - password [email or web address] [facility]  [facility]  Office  [phone]  CC: Primary care physician; Dortha KernBliss, Laura K, MD

## 2018-01-23 DEATH — deceased

## 2019-02-21 IMAGING — DX DG CHEST 1V
1 series · 1 of 1 positions shown · non-contrast
Comparison: 06/28/2017

CLINICAL DATA: Increasing shortness of breath. Nonproductive cough.
Wheezing.

EXAM:
CHEST 1 VIEW

[chest ap]
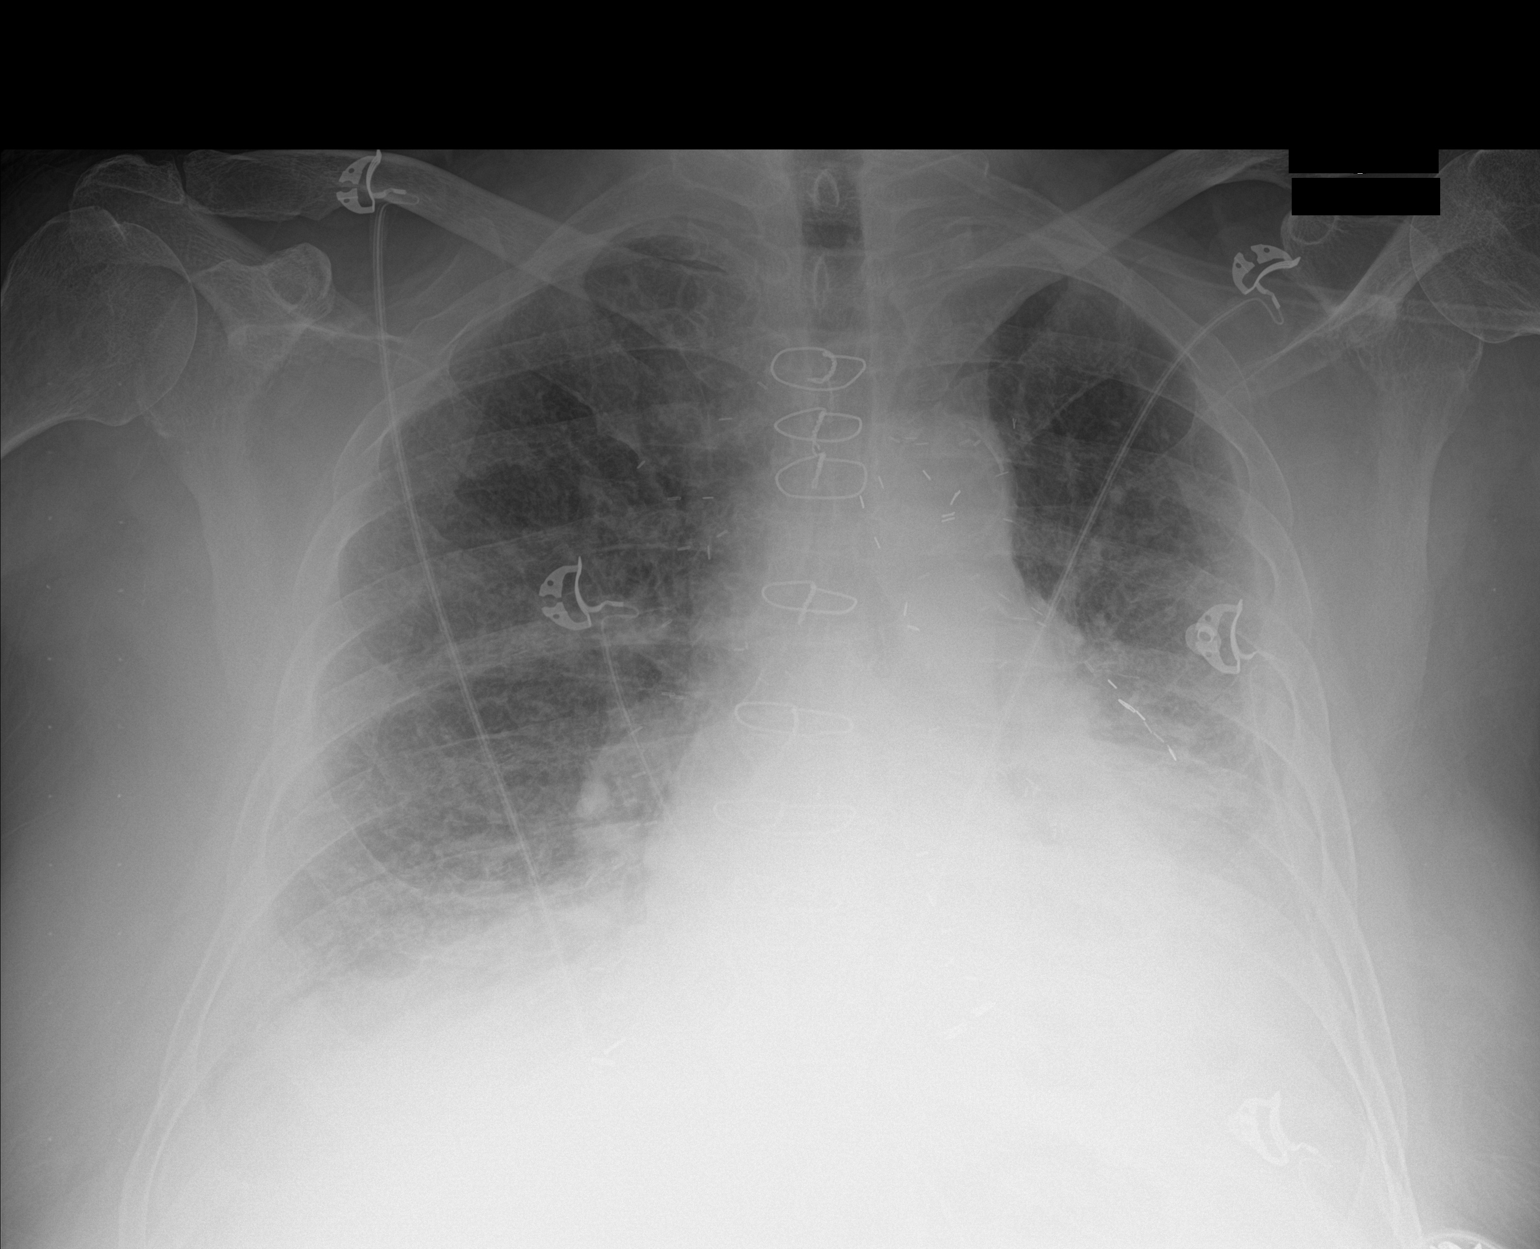

[1 of 1 positions shown; findings below may reference images not displayed]

FINDINGS: There is slight haziness at the lung bases which I suspect
represents mild pulmonary edema. Overall heart size and pulmonary
vascularity are normal. Chronic pleural thickening on the left.

Prior CABG.  Aortic atherosclerosis.

No acute bone abnormality.
IMPRESSION: Slight bibasilar haziness, left greater than right which I suspect
represents mild pulmonary edema.
